# Patient Record
Sex: Female | Born: 1970 | Race: White | Hispanic: No | Marital: Married | Smoking: Never smoker
Health system: Southern US, Community
[De-identification: ages and names within clinical notes are randomized; demographics above are authoritative.]

## PROBLEM LIST (undated history)

## (undated) DIAGNOSIS — F329 Major depressive disorder, single episode, unspecified: Secondary | ICD-10-CM

## (undated) DIAGNOSIS — J302 Other seasonal allergic rhinitis: Secondary | ICD-10-CM

## (undated) DIAGNOSIS — F32A Depression, unspecified: Secondary | ICD-10-CM

## (undated) DIAGNOSIS — M545 Low back pain, unspecified: Secondary | ICD-10-CM

## (undated) DIAGNOSIS — R202 Paresthesia of skin: Secondary | ICD-10-CM

## (undated) DIAGNOSIS — I10 Essential (primary) hypertension: Secondary | ICD-10-CM

## (undated) HISTORY — DX: Other seasonal allergic rhinitis: J30.2

## (undated) HISTORY — DX: Depression, unspecified: F32.A

## (undated) HISTORY — DX: Paresthesia of skin: R20.2

## (undated) HISTORY — DX: Essential (primary) hypertension: I10

## (undated) HISTORY — PX: WISDOM TOOTH EXTRACTION: SHX21

## (undated) HISTORY — DX: Low back pain, unspecified: M54.50

---
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
## 2004-12-03 ENCOUNTER — Inpatient Hospital Stay (HOSPITAL_COMMUNITY): Admission: AD | Admit: 2004-12-03 | Discharge: 2004-12-03 | Payer: Self-pay | Admitting: Obstetrics and Gynecology

## 2004-12-04 ENCOUNTER — Inpatient Hospital Stay (HOSPITAL_COMMUNITY): Admission: AD | Admit: 2004-12-04 | Discharge: 2004-12-04 | Payer: Self-pay | Admitting: Obstetrics & Gynecology

## 2004-12-05 ENCOUNTER — Inpatient Hospital Stay (HOSPITAL_COMMUNITY): Admission: AD | Admit: 2004-12-05 | Discharge: 2005-01-06 | Payer: Self-pay | Admitting: Obstetrics and Gynecology

## 2005-10-01 ENCOUNTER — Encounter: Admission: RE | Admit: 2005-10-01 | Discharge: 2005-12-30 | Payer: Self-pay | Admitting: Family Medicine

## 2006-07-05 IMAGING — US US OB COMP +14 WK
1 series · 13 of 27 positions shown · non-contrast
Comparison: none

CLINICAL DATA: 31 weeks estimated gestational age with pregnancy induced hypertension.  Assess growth.

[Series 1: us ob comp +14 wk · 27 acquisitions, 13 frames shown]
[im 2/27]
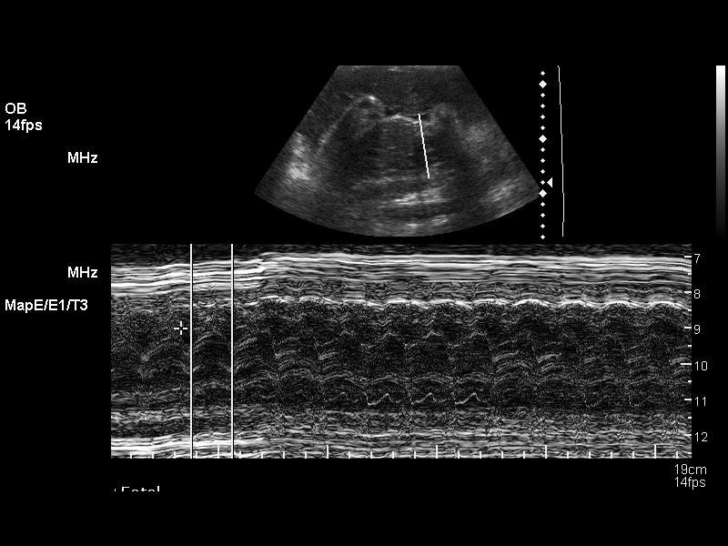
[im 4/27]
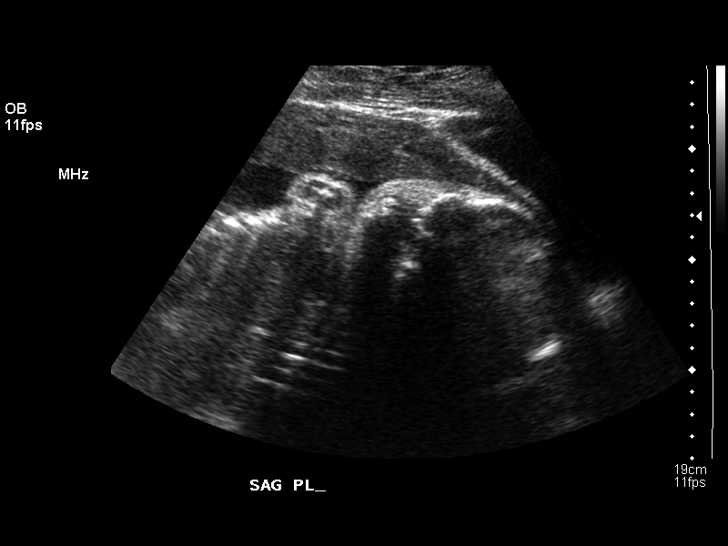
[im 6/27]
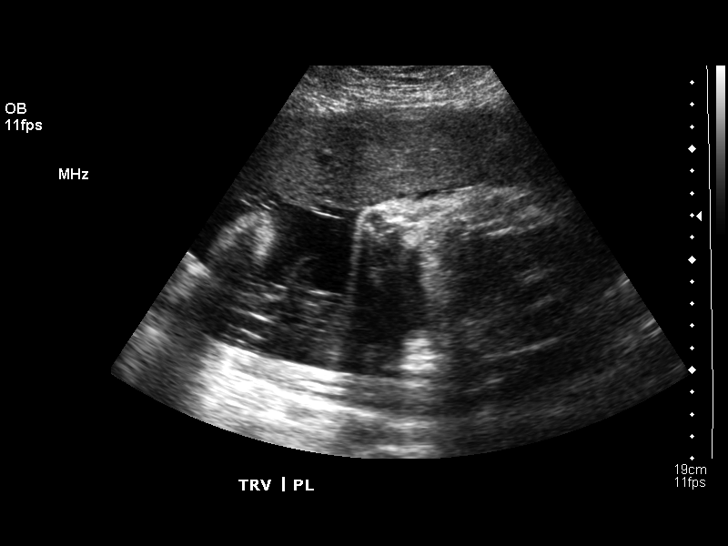
[im 8/27]
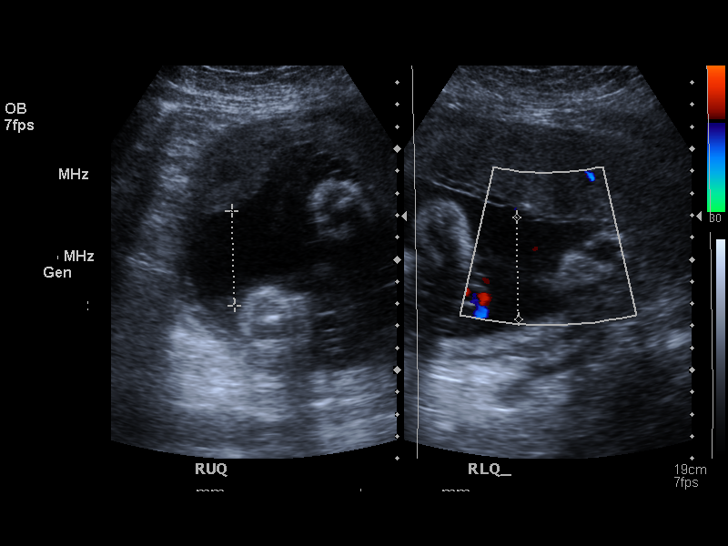
[im 10/27]
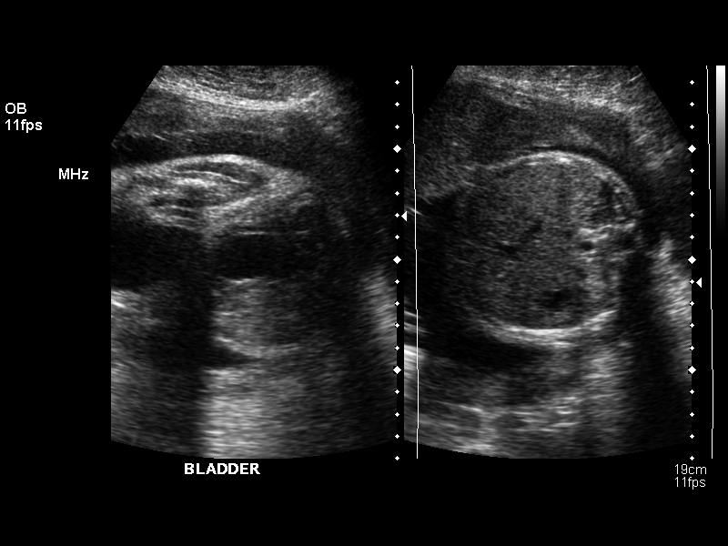
[im 12/27]
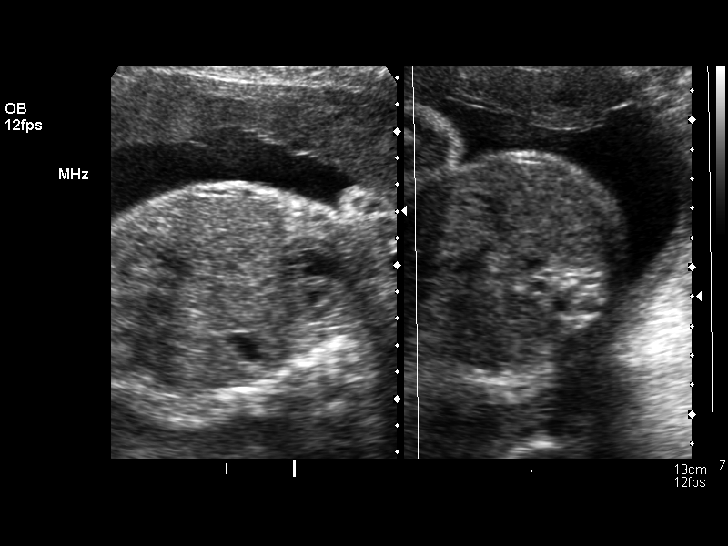
[im 14/27]
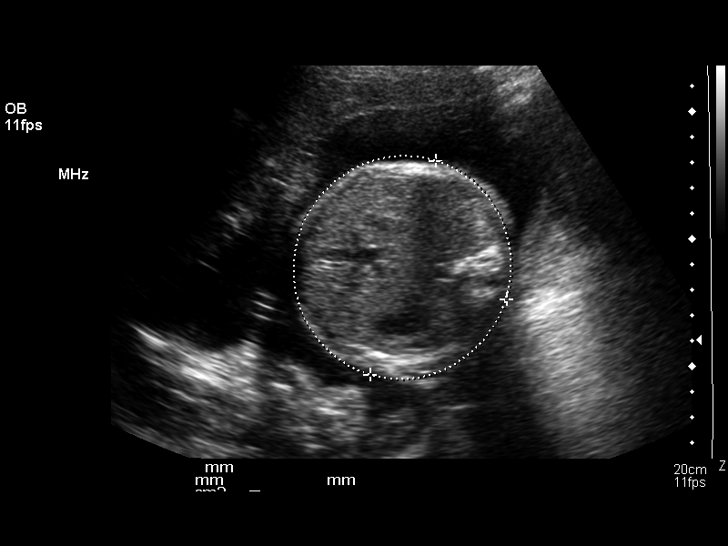
[im 16/27]
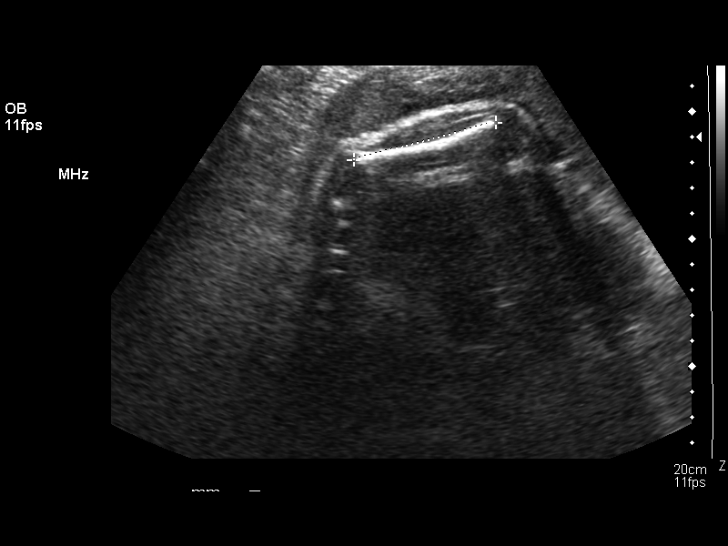
[im 18/27]
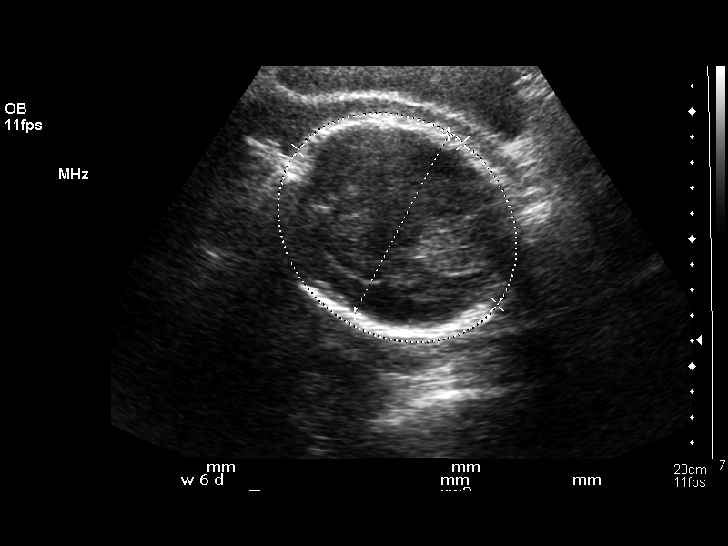
[im 20/27]
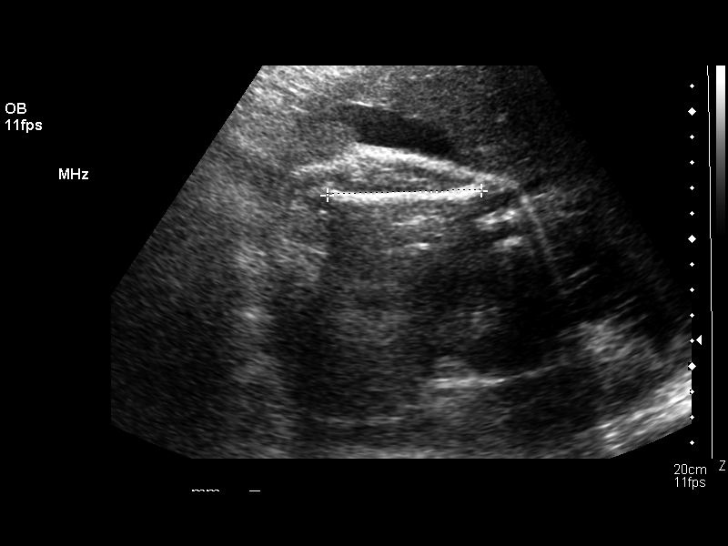
[im 22/27]
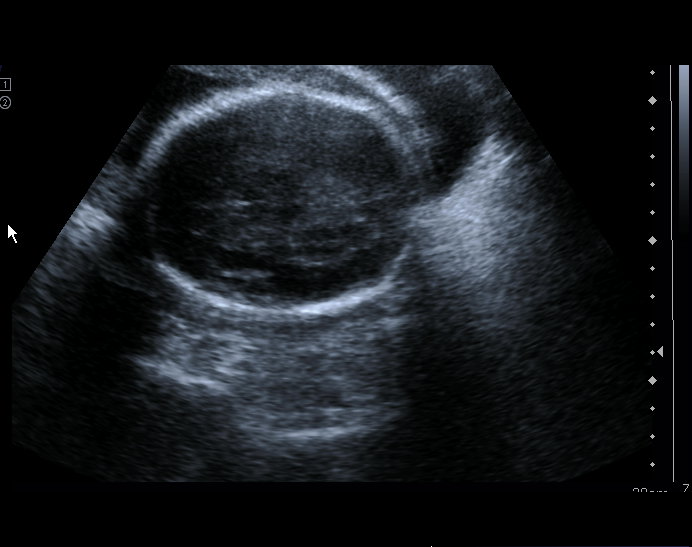
[im 24/27]
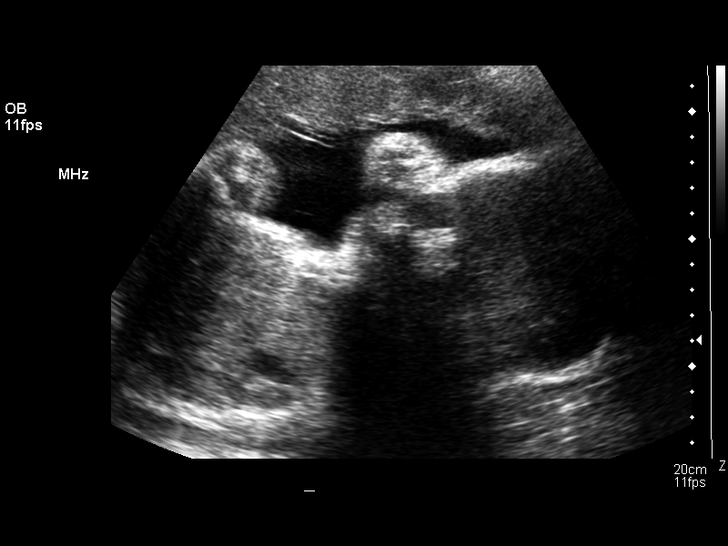
[im 26/27]
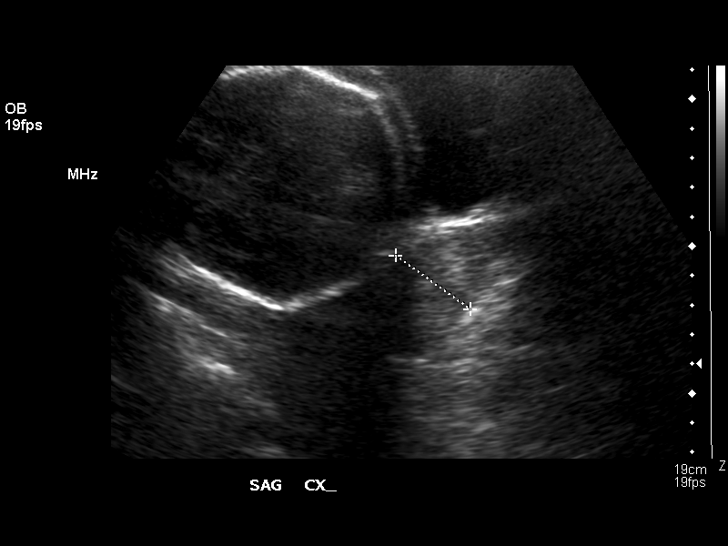

[13 of 27 positions shown; findings below may reference images not displayed]

OBSTETRICAL ULTRASOUND:
Number of Fetuses:  1
Heart Rate:  160
Movement:  Yes
Breathing:  No
Presentation:  Cephalic
Placental Location:  Anterior
Grade:  I
Previa:  No
Amniotic Fluid (Subjective):  Normal
Amniotic Fluid (Objective):   12.8 cm AFI (5th -95th%ile = 8.8 ? 23.8 cm for 31 wks)

FETAL BIOMETRY
BPD:  7.9 cm   31 w 6 d
HC:  28.7 cm   31 w 4 d
AC:  27.1 cm   31 w 2 d
FL:    5.9 cm   30 w 5 d

MEAN GA:  31 w 3 d
Fetal indices are within normal limits.
EFW:  2035 g (H) 50th ? 75th%ile (6623 ? 3530 g) For 31 wks

FETAL ANATOMY
Lateral Ventricles:    Visualized 
Thalami/CSP:      Not visualized 
Posterior Fossa:  Not visualized 
Nuchal Region:    N/A
Spine:      Not visualized 
4 Chamber Heart on Left:      Not visualized   
Stomach on Left:      Visualized 
3 Vessel Cord:    Not visualized 
Cord Insertion site:    Not visualized 
Kidneys:  Visualized 
Bladder:  Visualized 
Extremities:      Not visualized 

ADDITIONAL ANATOMY VISUALIZED:  Diaphragm.

Evaluation limited by:  Advanced gestational age 

MATERNAL UTERINE AND ADNEXAL FINDINGS
Cervix:   3.1 cm Transabdominally
IMPRESSION: 1.  Single intrauterine pregnancy demonstrating an estimated gestational age by ultrasound of 31 weeks and 3 days.  Correlation with assigned estimated gestational age by ultrasound of 30 weeks and 6 days suggests appropriate growth. Currently the estimated fetal weight is just below the 75th percentile for a 31 week gestation.
2.  Subjectively and quantitatively normal amniotic fluid volume and normal cervical length.
3.  No late developing fetal anatomic abnormalities are identified associated with the lateral ventricles, stomach, kidneys or bladder.  A four chamber heart view could not be reassessed due to positioning on today?s exam.

## 2006-07-09 IMAGING — US US FETAL BPP W/O NONSTRESS
1 series · 18 of 28 positions shown · non-contrast
Comparison: none

CLINICAL DATA: Pregnancy induced hypertension.  Assess fetal well-being and obstetrical Doppler.  G1 P0.

[Series 1: us fetal bpp w/o nonstress · non-contrast · 18 of 33 slices shown]
[im 1/33]
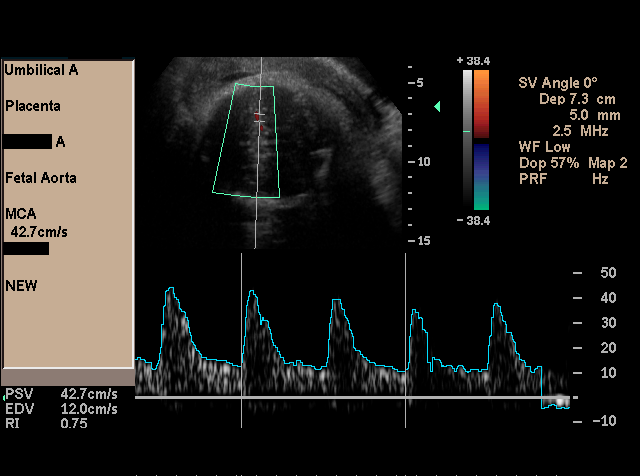
[im 3/33]
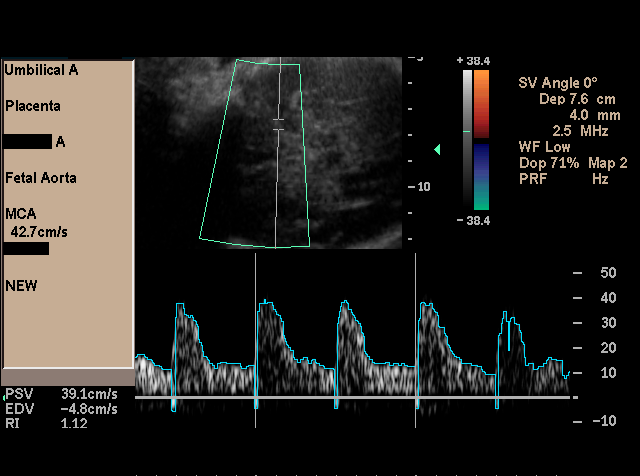
[im 4/33]
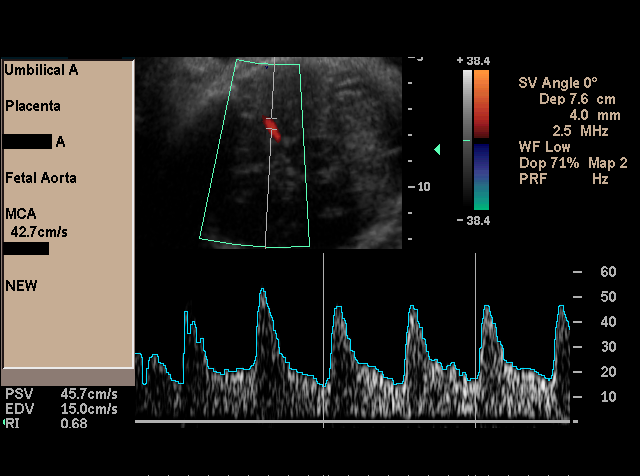
[im 6/33]
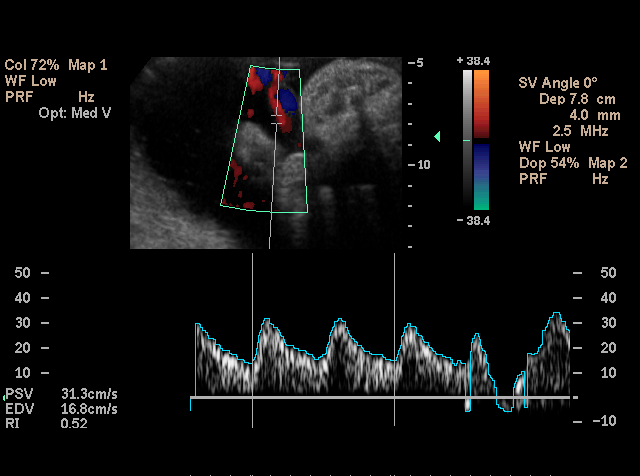
[im 9/33]
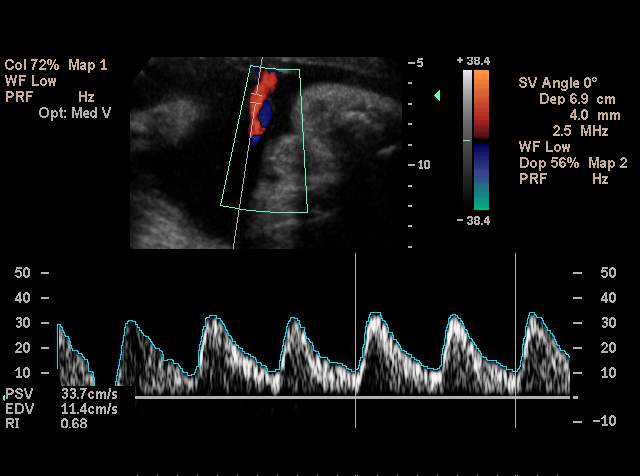
[im 10/33]
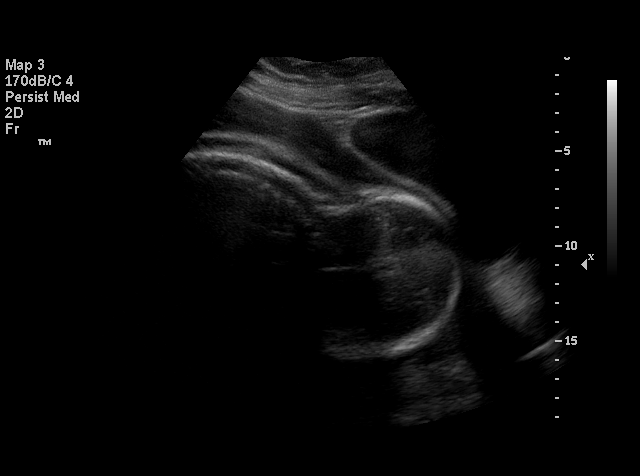
[im 12/33]
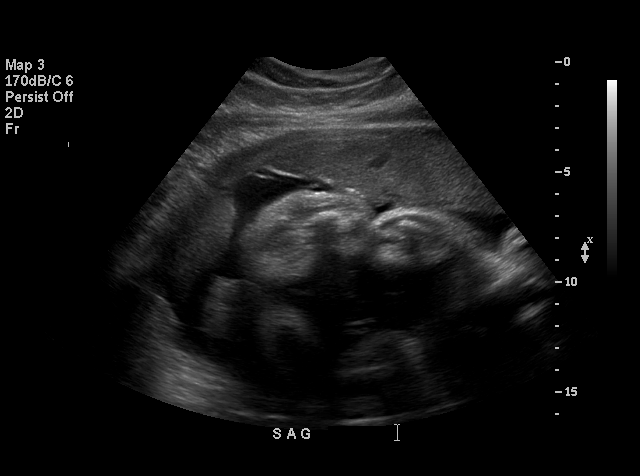
[im 14/33]
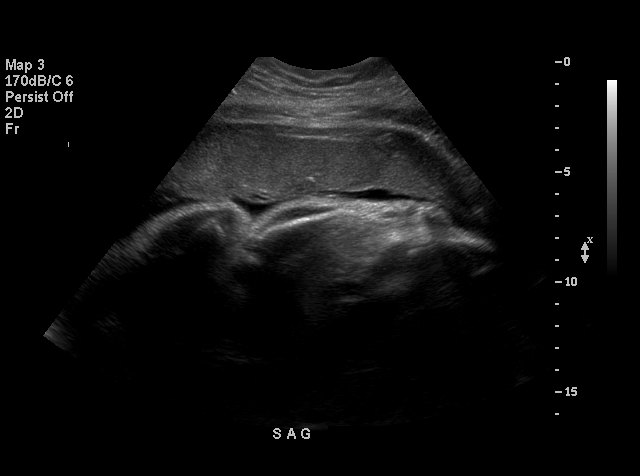
[im 16/33]
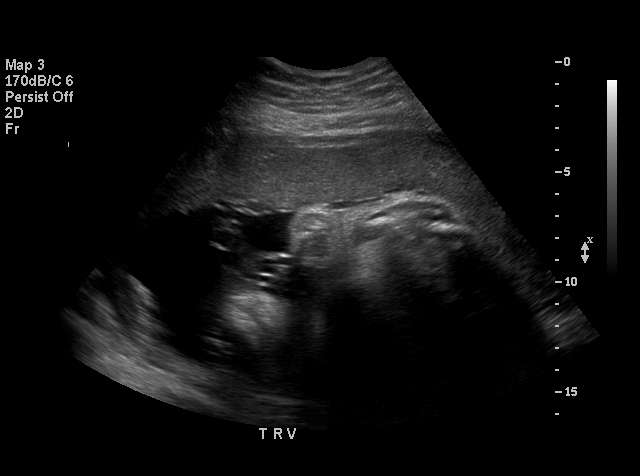
[im 17/33]
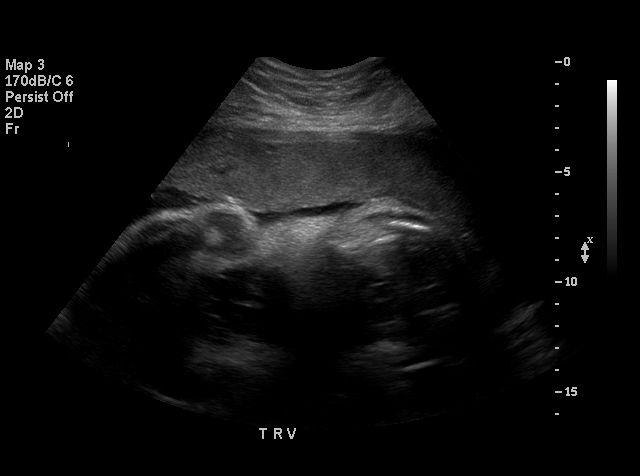
[im 19/33]
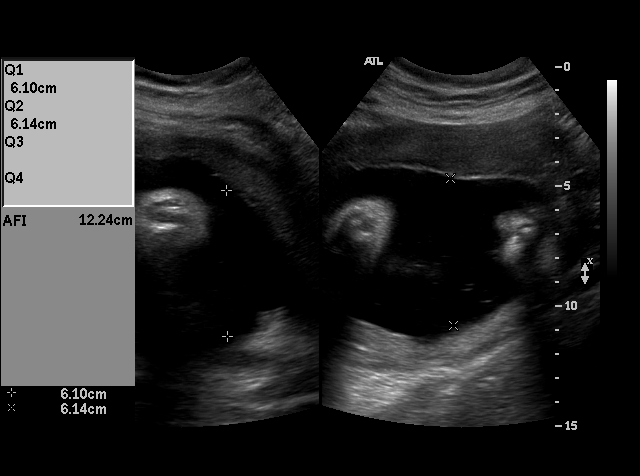
[im 21/33]
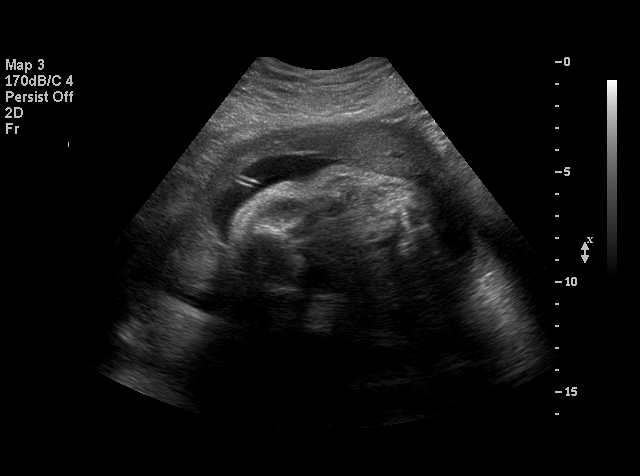
[im 23/33]
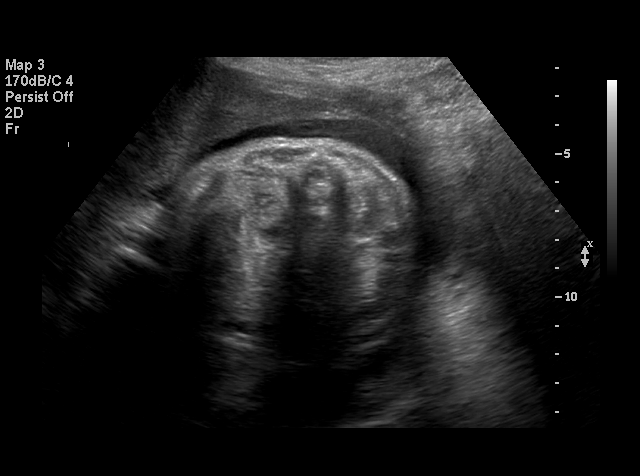
[im 25/33]
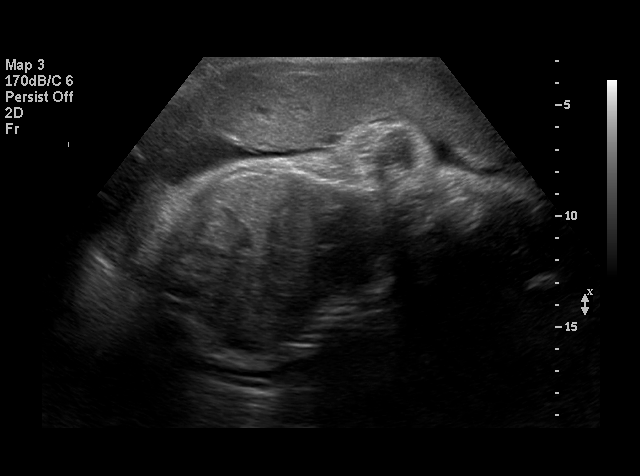
[im 27/33]
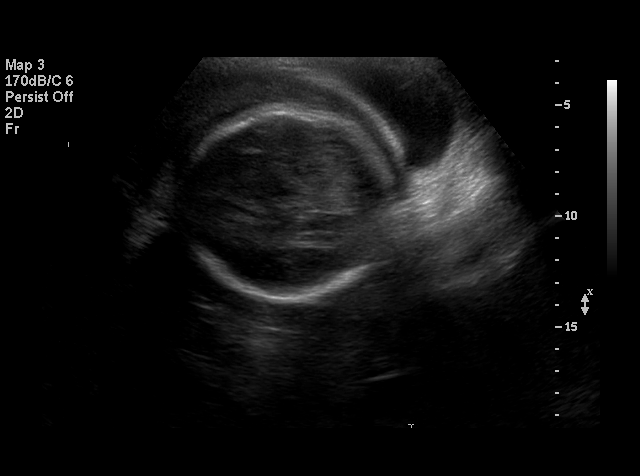
[im 29/33]
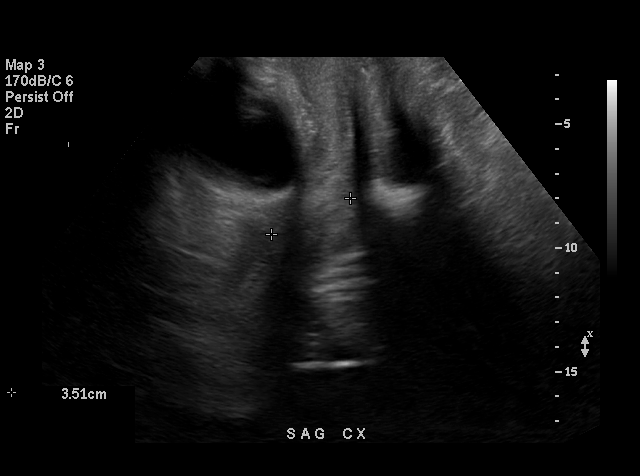
[im 30/33]
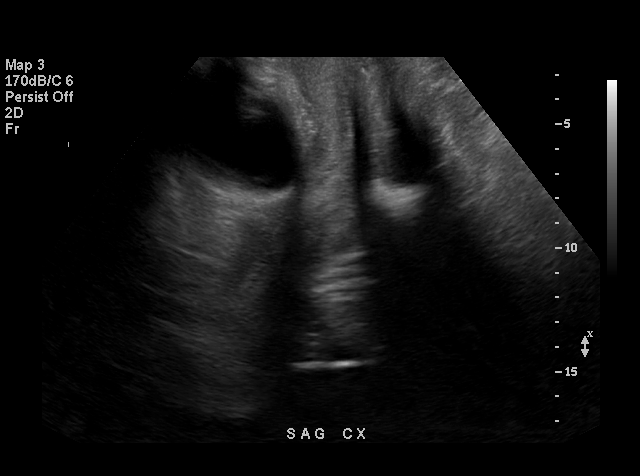
[im 33/33]
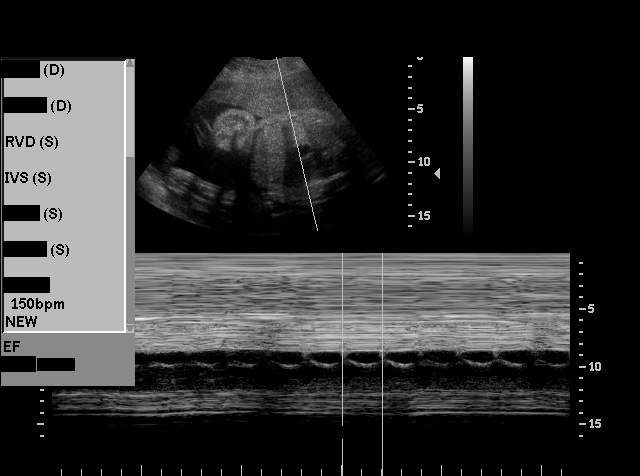

[18 of 28 positions shown; findings below may reference images not displayed]

BIOPHYSICAL PROFILE

 Number of Fetuses:  1
 Heart rate:  150
 Movement:  Yes
 Breathing:  Yes
 Presentation:  Cephalic
 Placental Location:  Anterior
 Grade:  I
 Previa:  No
 Amniotic Fluid (Subjective):  Normal
 Amniotic Fluid (Objective):  20.7 cm AFI (5th -95th%ile = 8.8 ? 23.8 cm for 31 wks)

 Fetal measurements and complete anatomic evaluation were not requested.  The following fetal anatomy was visualized on this exam:  Lateral ventricles, stomach, kidneys and bladder.  

 BPP SCORING
 Movements:  2  Time:  30 minutes
 Breathing:  2
 Tone:  2
 Amniotic Fluid:  2
 Total Score:  8

 MATERNAL UTERINE AND ADNEXAL FINDINGS
 Cervix:  3.7 cm Translabially
IMPRESSION: 1.  Single living intrauterine fetus in cephalic presentation with biophysical profile score [DATE] in 30 minutes of scanning.  
 2.  Normal amniotic fluid volume with AFI 20.7 cm.  
 DOPPLER ULTRASOUND OF FETUS:

 Umbilical Artery S/D Ratio:  2.45    (NL< 4.00)

 Middle Cerebral Artery PI:  1.43    (NL> 1.48)
IMPRESSION: Slightly abnormal middle cerebral artery pulsatility index indicating very slightly increased diastolic flow.

## 2006-07-12 IMAGING — US US FETAL BPP W/O NONSTRESS
1 series · 14 of 28 positions shown · non-contrast
Comparison: none

CLINICAL DATA: Assigned gestational age is 31 weeks 6 days.

[Series 1: us fetal bpp w/o nonstress · 0.41mm/px · 40 acquisitions, 14 frames shown]
[im 2/40]
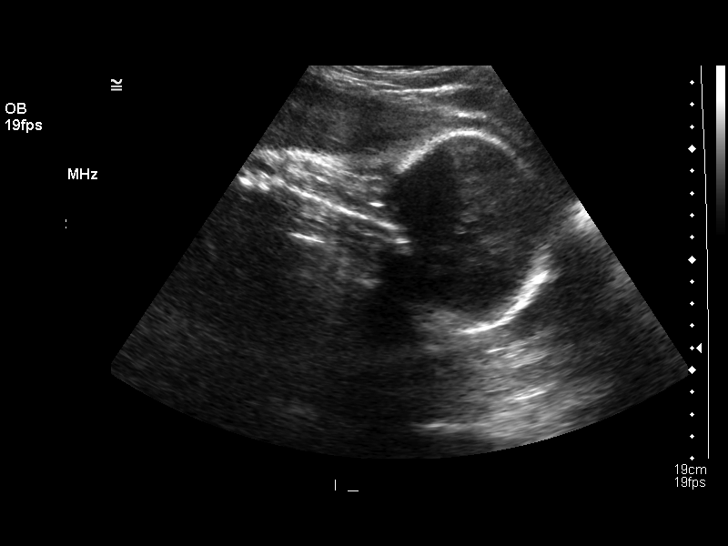
[im 5/40]
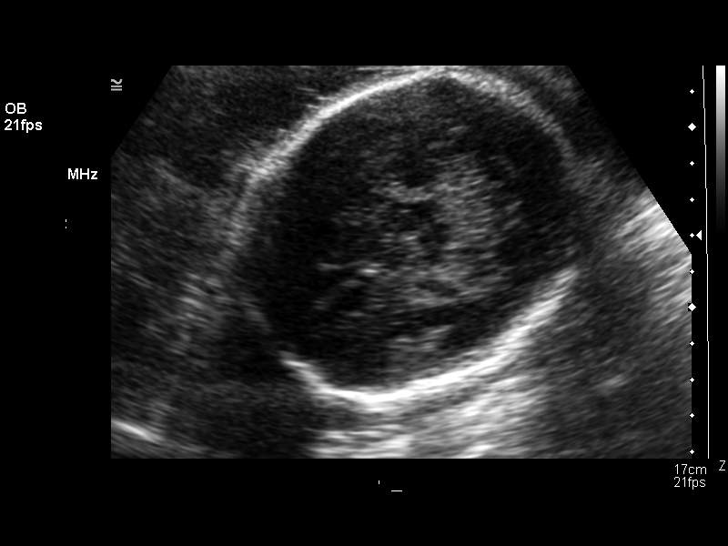
[im 8/40]
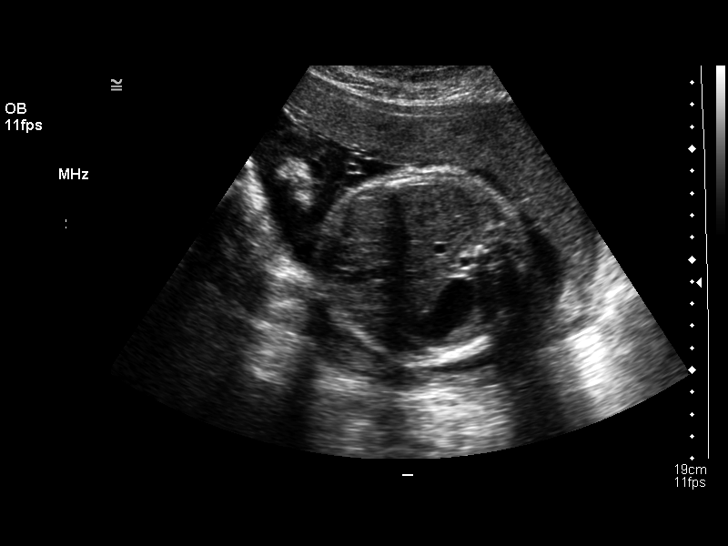
[im 11/40]
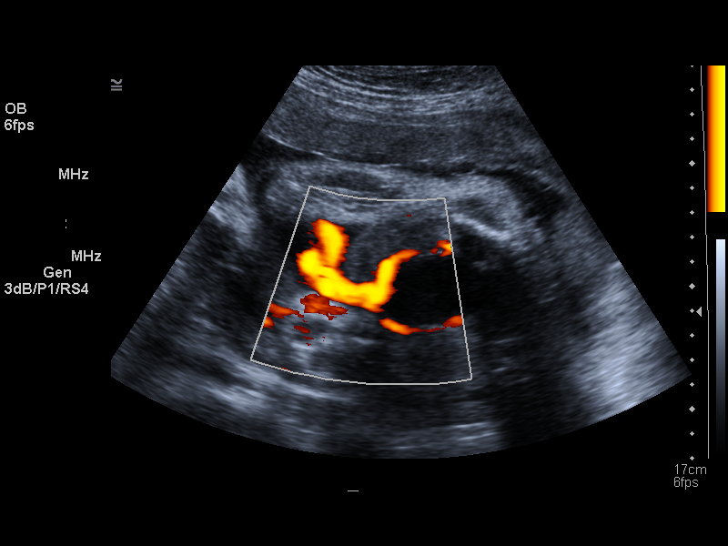
[im 14/40]
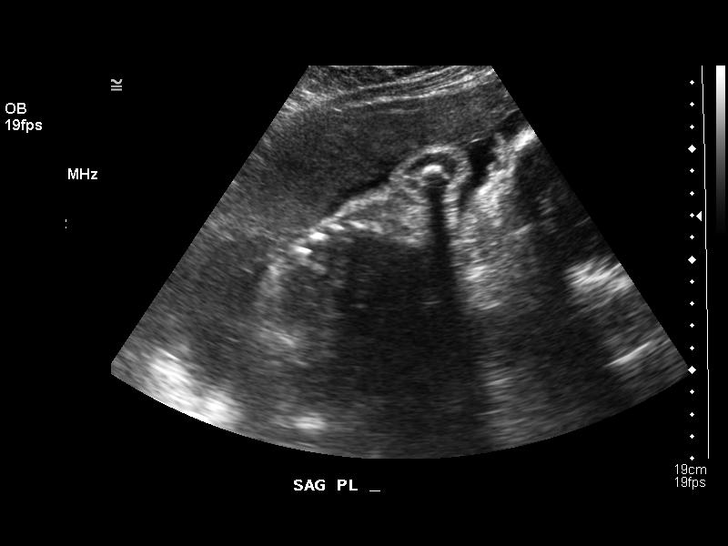
[im 16/40]
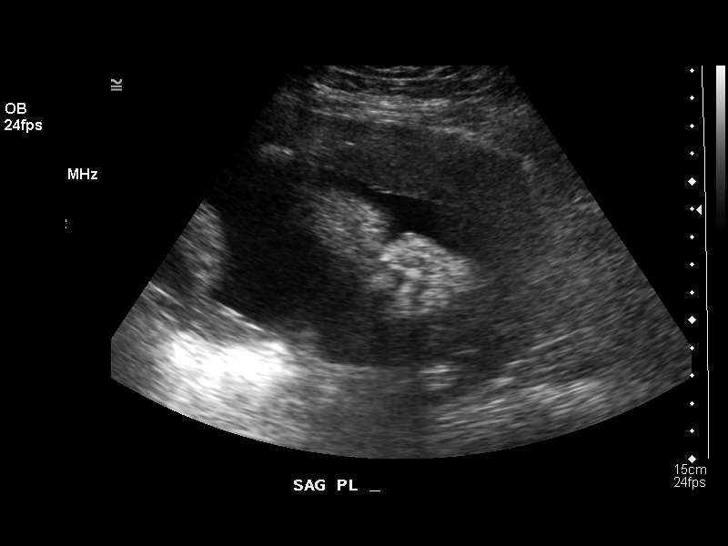
[im 19/40]
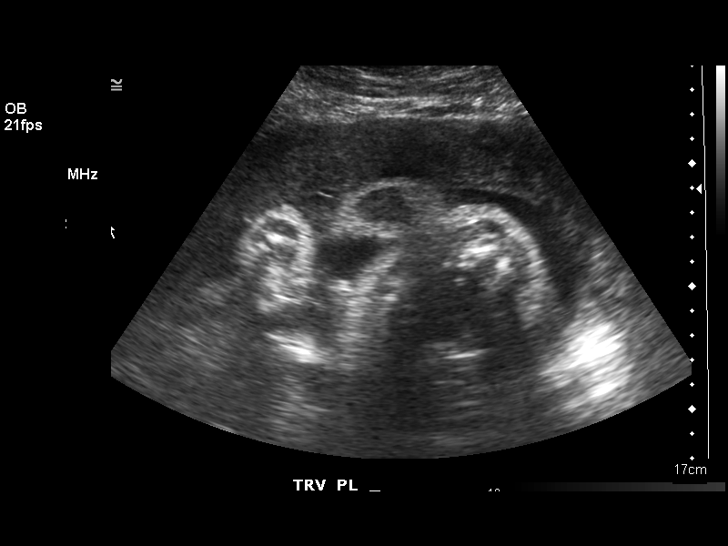
[im 22/40]
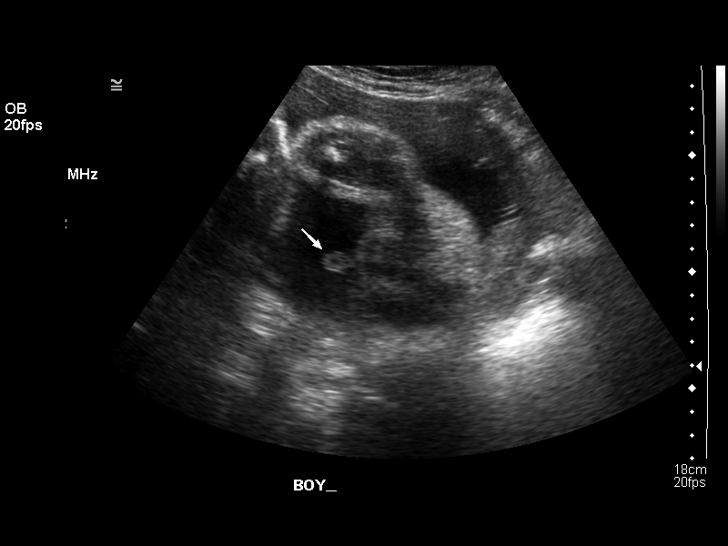
[im 25/40]
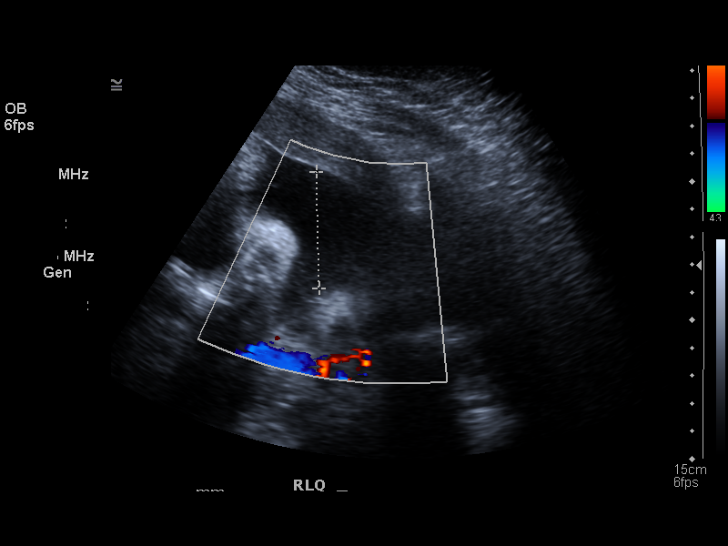
[im 28/40]
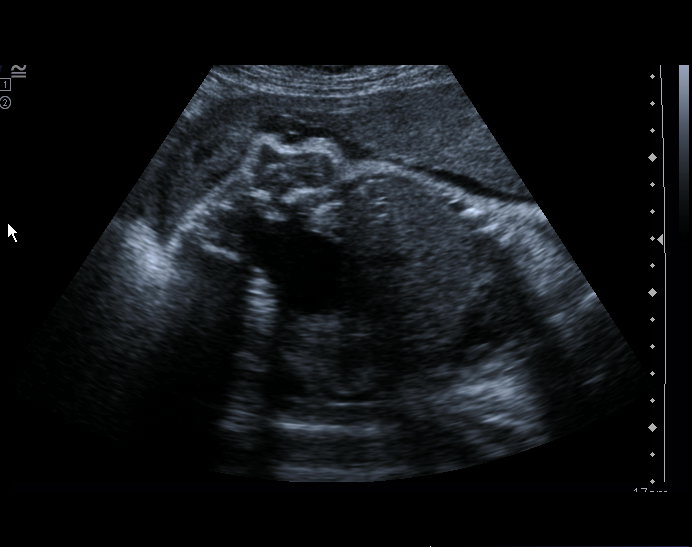
[im 31/40]
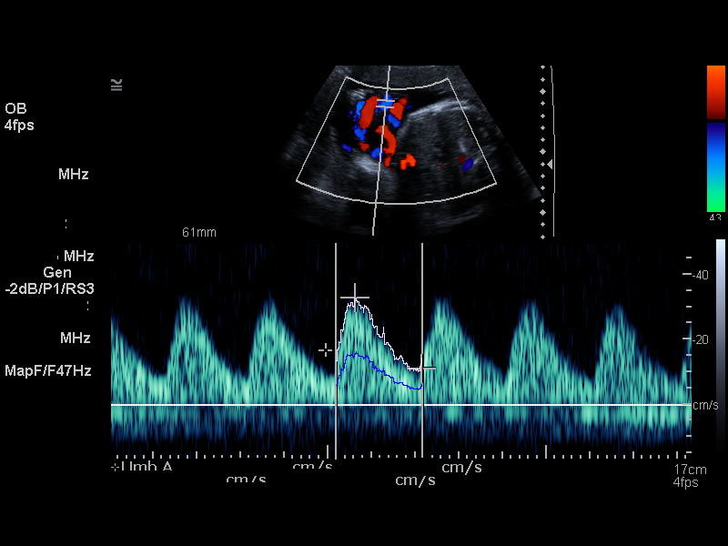
[im 34/40]
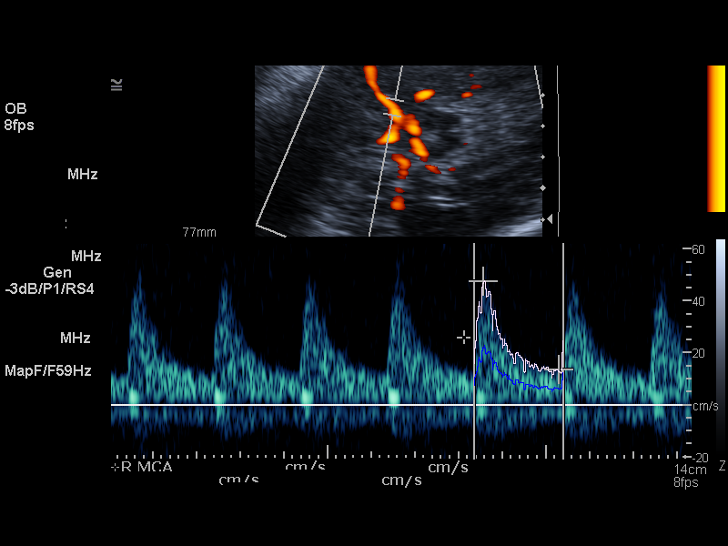
[im 37/40]
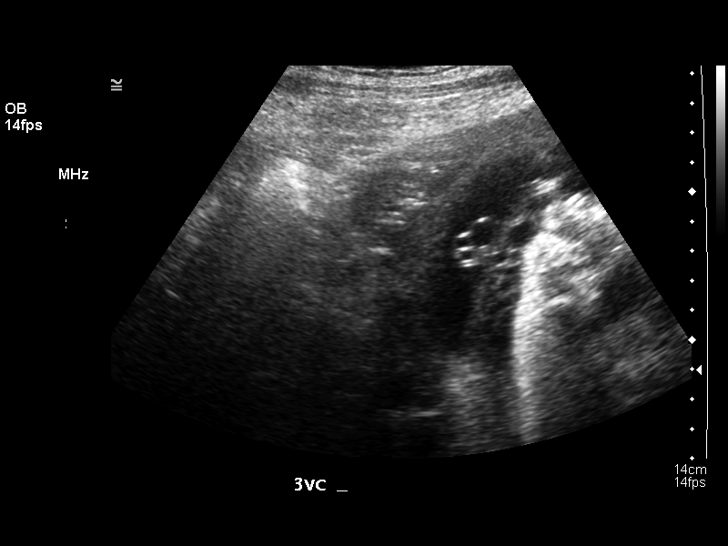
[im 40/40]
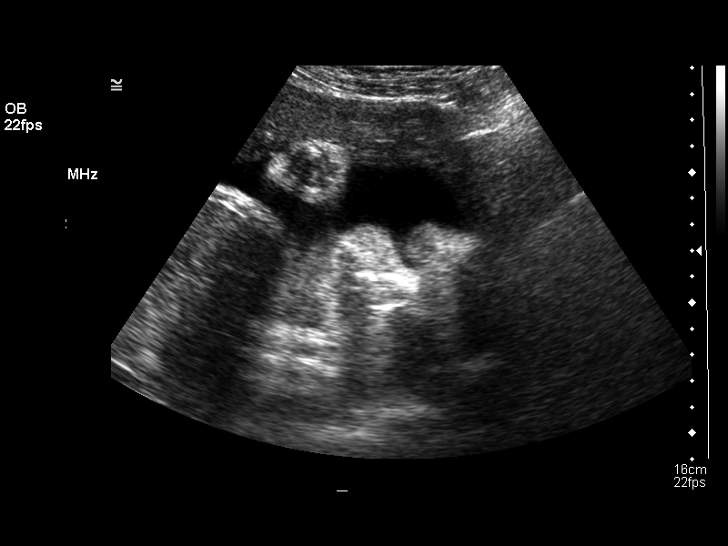

[14 of 28 positions shown; findings below may reference images not displayed]

BIOPHYSICAL PROFILE

 Number of Fetuses:  1
 Heart rate:  144 bpm
 Movement:  Yes 
 Breathing:  Yes 
 Presentation:  Cephalic 
 Placental Location:  Anterior
 Grade:  1
 Previa:  No 
 Amniotic Fluid (Subjective):  Normal 
 Amniotic Fluid (Objective):  AFI 17.1 cm (3th-13th %ile = 8.6-24.2 cm for 32 weeks) 

 Fetal measurements and complete anatomic evaluation were not requested.  The following fetal anatomy was visualized on this exam:  Lateral ventricles, thalami, posterior fossa, stomach on the left, three vessel cord, kidneys, bladder and diaphragm

 BPP SCORING
 Movements:  2
 Breathing:  2
 Tone:  2
 Amniotic Fluid:  2
 Total Score:  [DATE]

 MATERNAL UTERINE AND ADNEXAL FINDINGS
 Cervix:  Not evaluated. 

 DOPPLER ULTRASOUND OF FETUS:

 Umbilical Artery S/D Ratio:  2.72 (NL< 3.85)

 Middle Cerebral Artery PI:  1.56 (NL> 1.44)
IMPRESSION: There is a single living intrauterine gestation in cephalic presentation.   The biophysical score is [DATE] over a 30 minute period.   The umbilical artery and middle cerebral artery Doppler exam is normal.

## 2006-07-26 IMAGING — US US OB FOLLOW-UP
1 series · 13 of 28 positions shown · non-contrast
Comparison: none

CLINICAL DATA: 31 weeks estimated gestational age with pregnancy induced hypertension.   Assess growth and fetal well being.

[Series 1: us ob follow-up · 0.35mm/px · 13 of 34 slices shown]
[im 2/34]
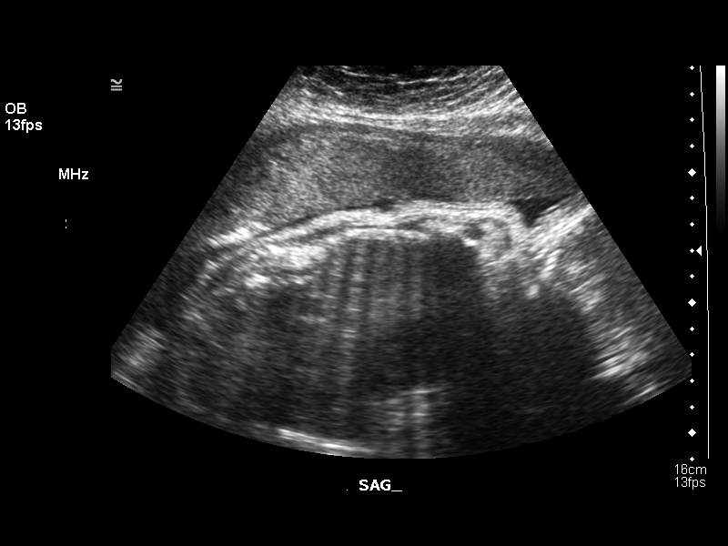
[im 4/34]
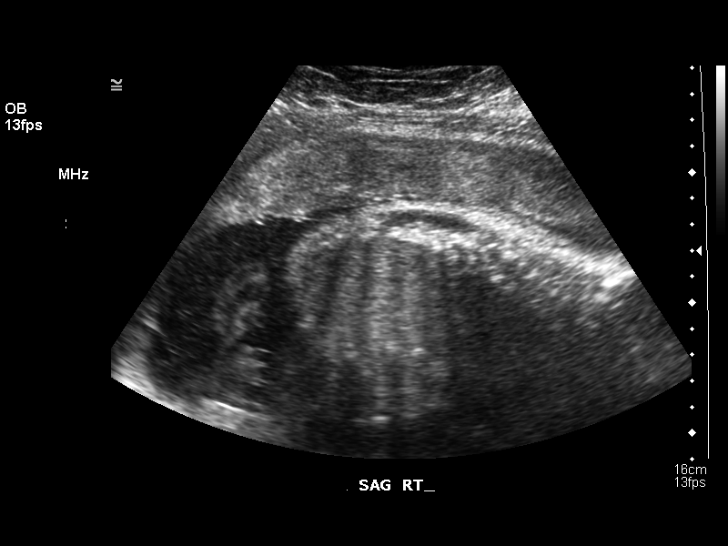
[im 7/34]
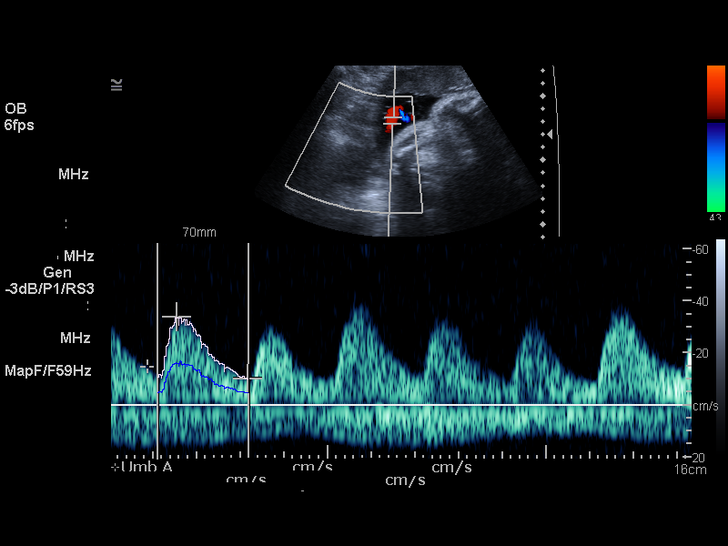
[im 9/34]
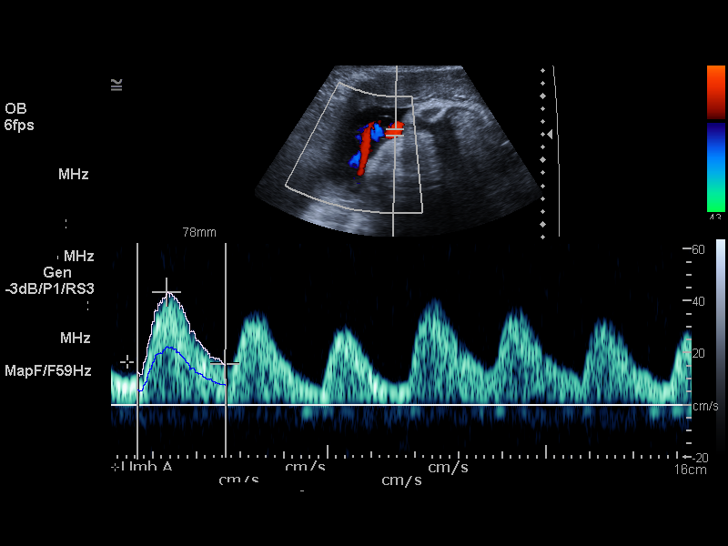
[im 12/34]
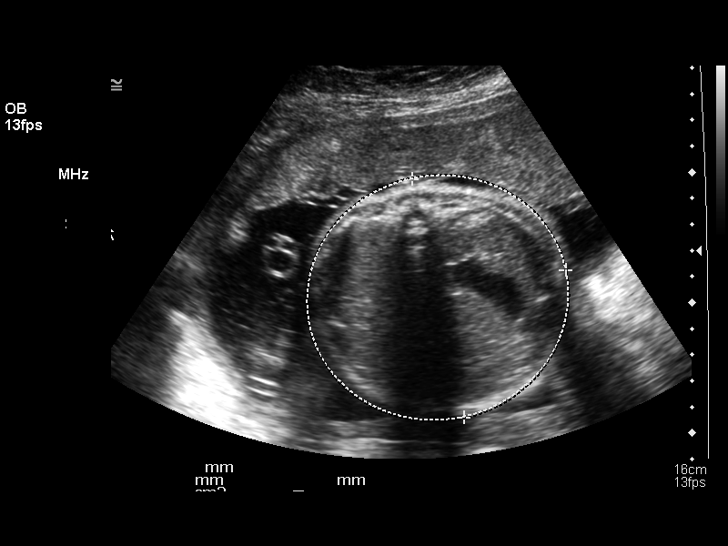
[im 14/34]
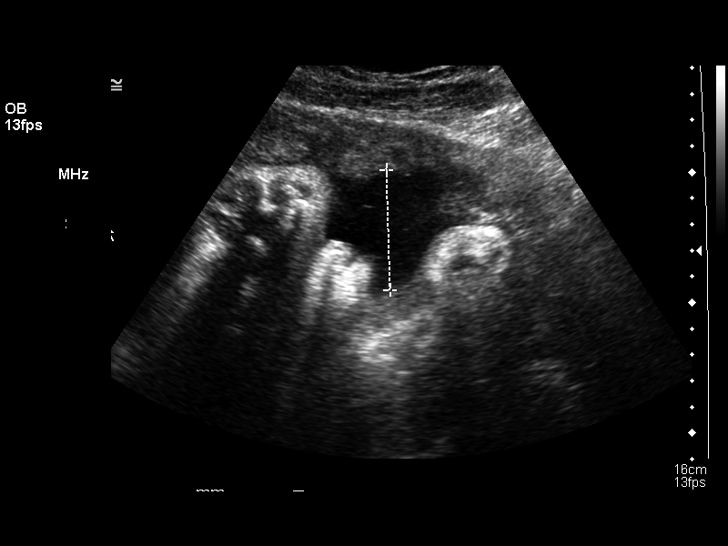
[im 18/34]
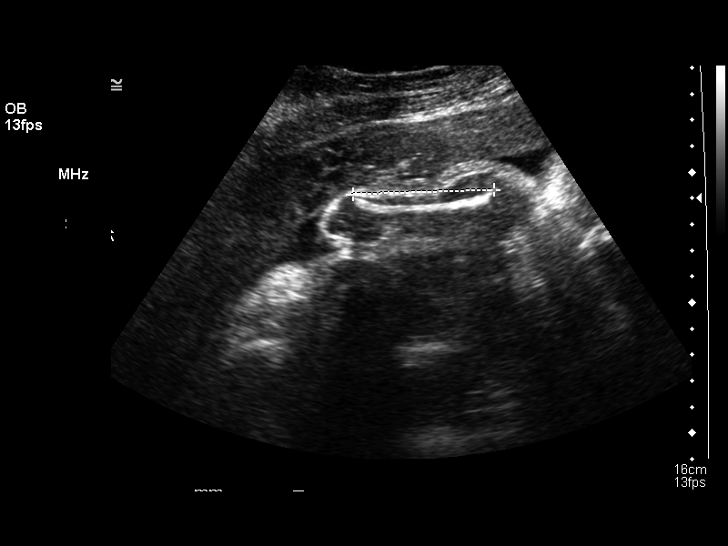
[im 20/34]
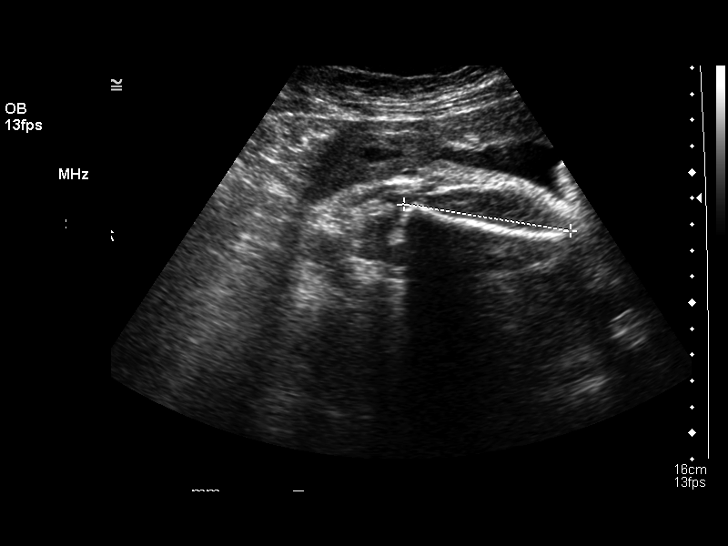
[im 23/34]
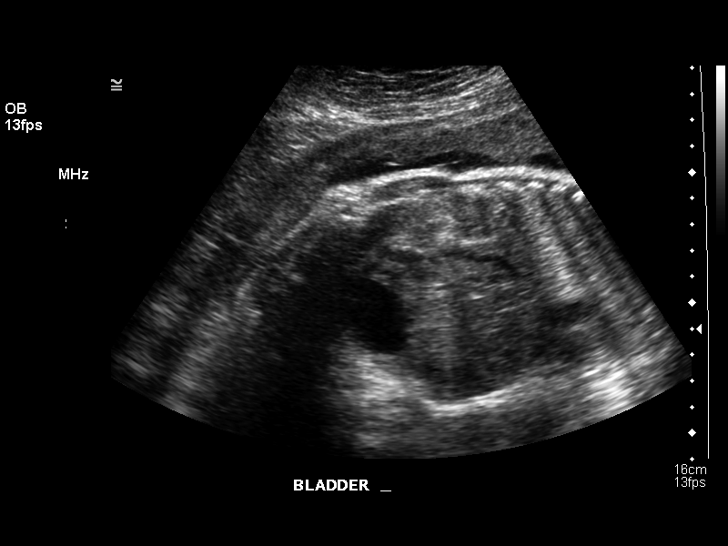
[im 25/34]
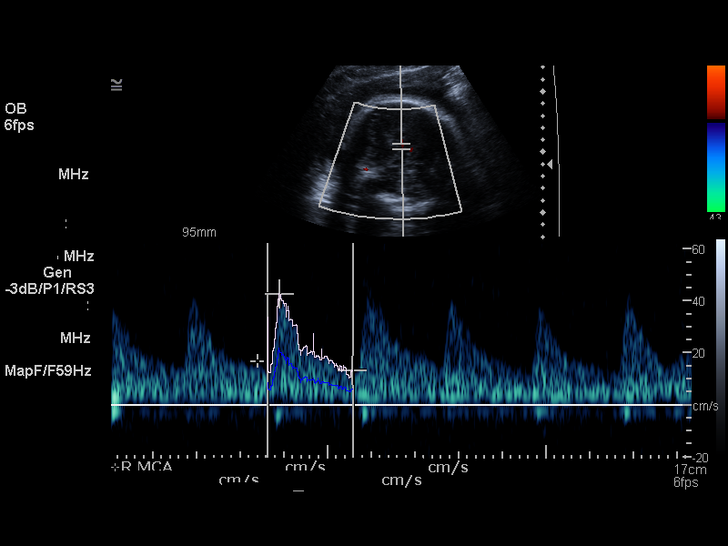
[im 27/34]
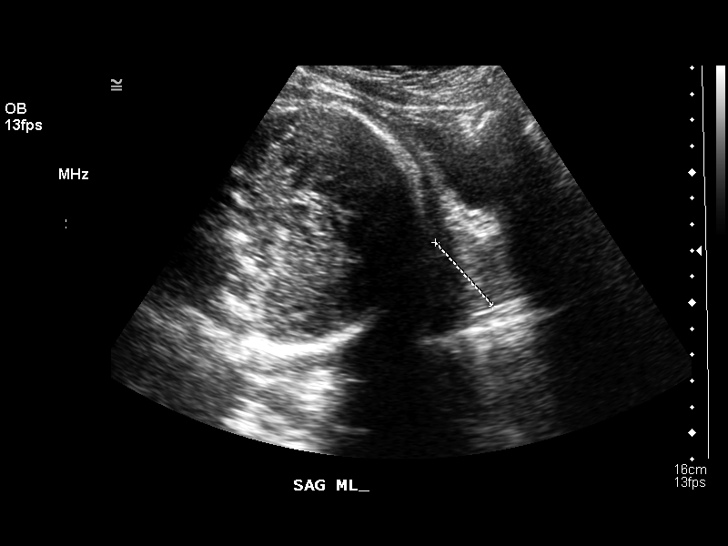
[im 30/34]
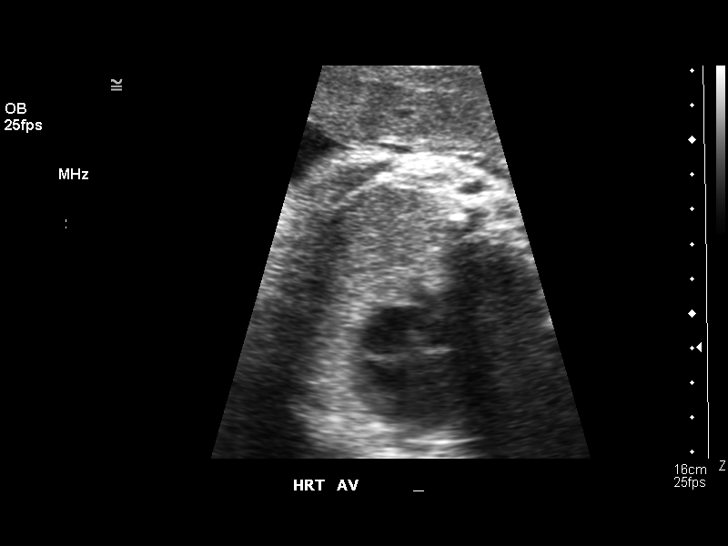
[im 32/34]
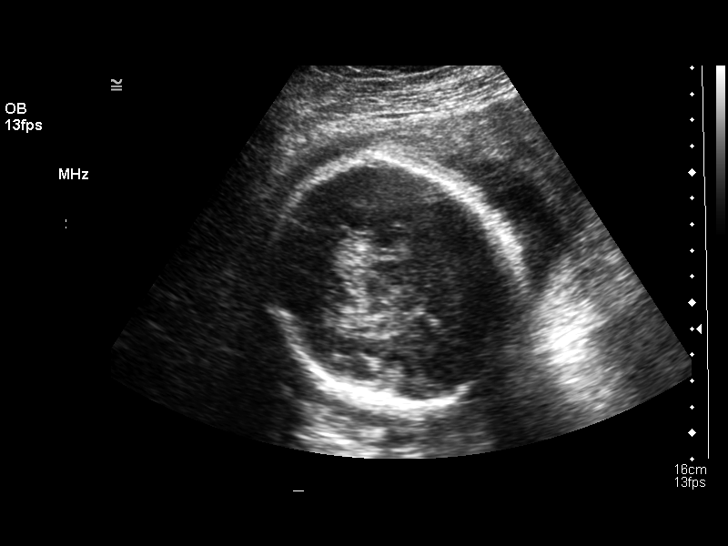

[13 of 28 positions shown; findings below may reference images not displayed]

OBSTETRICAL ULTRASOUND RE-EVALUATION:
Number of Fetuses:  1
Heart Rate:  157 bpm
Movement:  Yes 
Breathing:  Yes 
Presentation:  Cephalic 
Placental Location:  Anterior
Grade:  2
Previa:  No 
Amniotic Fluid (subjective):  Normal 
Amniotic Fluid (objective):  AFI 13 cm (1th-81th %ile = 8.1-24.8 cm for 34 weeks) 

FETAL BIOMETRY
BPD:  8.5 cm  34 w  3 d
HC:  30.8 cm  34 w  3 d
AC:  30.5 cm  34 w  4 d
FL:  6.4 cm  33 w  1 d

Fetal indices are within normal limits. 

MEAN GA:  33 w  5 d 
Assigned GA:  33 w  6 d

EFW:  6184 grams (H) 50-75th %ile (9029-7007 g) for 34 weeks 

FETAL ANATOMY
Lateral Ventricles:  Visualized 
Thalami/CSP:  Visualized 
Posterior Fossa:  Previously visualized 
Nuchal Region:  Not Visualized 
Spine:  Not Visualized 
4 Chamber Heart on Left:  Not Visualized 
Stomach on Left:  Visualized 
3 Vessel Cord:  Visualized 
Cord Insertion Site:  Not Visualized 
Kidneys:  Visualized 
Bladder:  Visualized 
Extremities:  Not Visualized 

Evaluation limited by:  Fetal position

  MATERNAL UTERINE AND ADNEXAL FINDINGS
Cervix:  3.2 cm transabdominally 

BIOPHYSICAL PROFILE

Movement:  2  Time:  20 minutes
Breathing:  2
Tone:  2
Amniotic Fluid:  2

Total Score:  [DATE]

DOPPLER ULTRASOUND OF FETUS:

Umbilical Artery S/D Ratio:  3.09 (NL< 3.57)

Middle Cerebral Artery PI:  1.41 (NL> 1.35)
IMPRESSION: 1.   Single intrauterine pregnancy demonstrating an estimated gestational age by ultrasound of 33 weeks and 5 days.   Correlation with assigned gestational age of 33 weeks and 6 days suggests appropriate growth.   The estimated fetal weight remains between the 50 and 75th percentile on today?s exam and suggests appropriate linear growth in comparison with prior growth performed at 31 weeks.   

2.  Subjectively and quantitatively normal amniotic fluid volume.  Normal cervical length. 

3.  Biophysical profile score [DATE].  

4.  Normal umbilical artery systolic to diastolic ratio and middle cerebral artery pulsatility index.   No absence or reversal of end diastolic flow was seen on any of the interrogated strips. 

5.  No late developing fetal anatomic abnormalities are seen associated with the lateral ventricles, stomach, kidneys or bladder.   A four chamber heart view could not be well visualized due to positioning on today?s exam.

## 2006-08-03 ENCOUNTER — Encounter: Admission: RE | Admit: 2006-08-03 | Discharge: 2006-08-03 | Payer: Self-pay | Admitting: Obstetrics and Gynecology

## 2006-08-03 IMAGING — US US AMNIOCENTESIS
1 series · 6 of 6 positions shown · non-contrast
Comparison: none

[Series 1: us amniocentesis · 0.43mm/px · 6 of 6 slices shown]
[im 1/6]
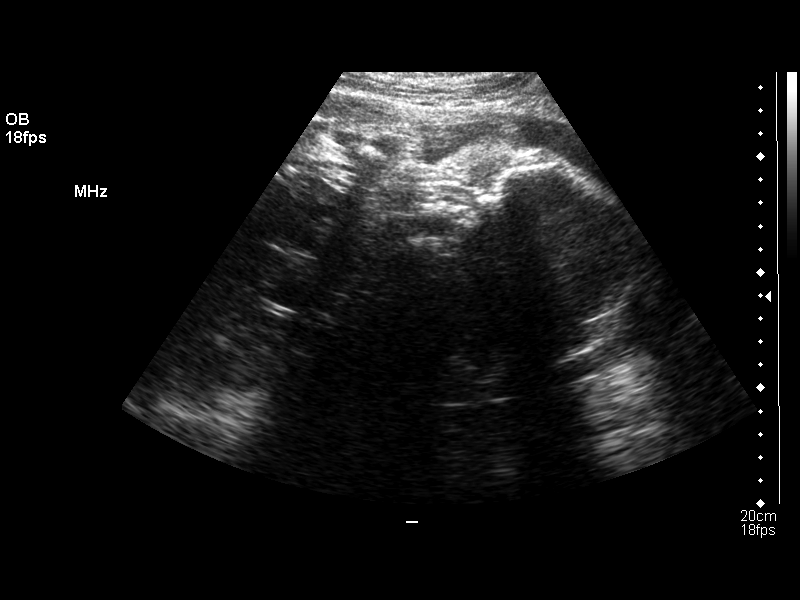
[im 2/6]
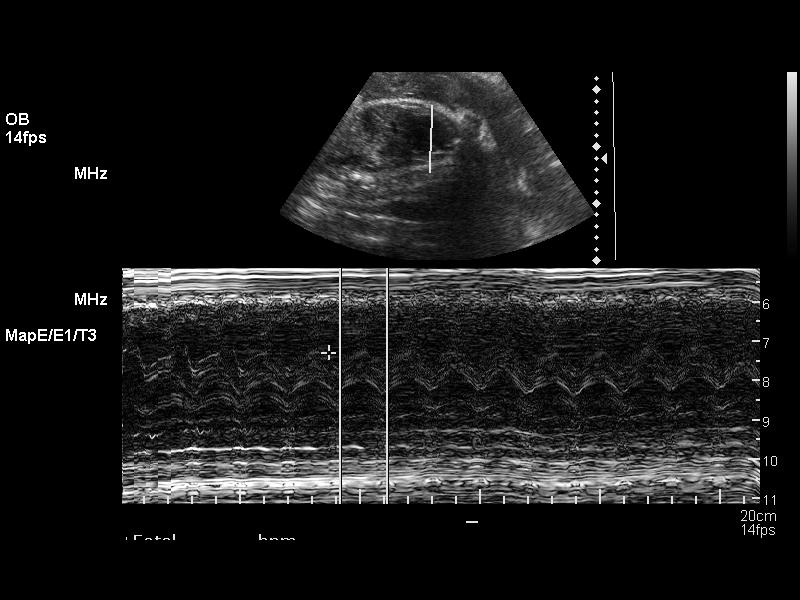
[im 3/6]
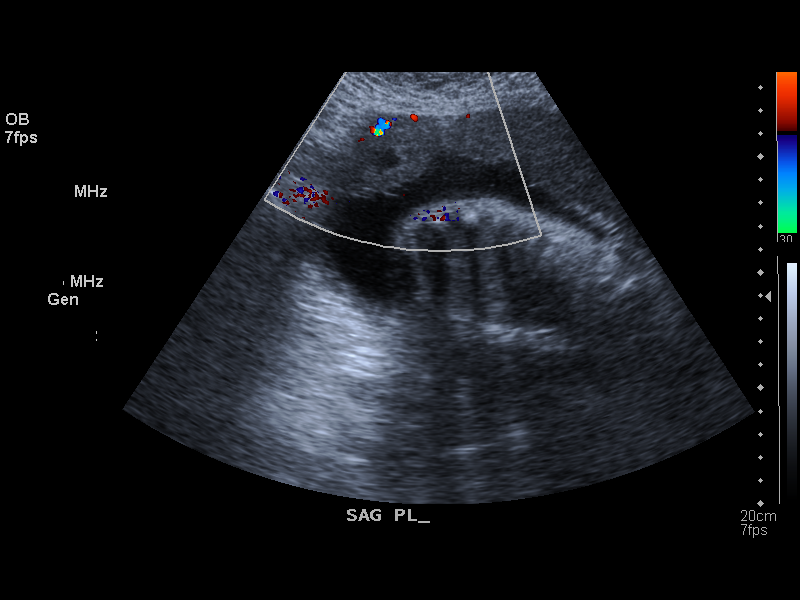
[im 4/6]
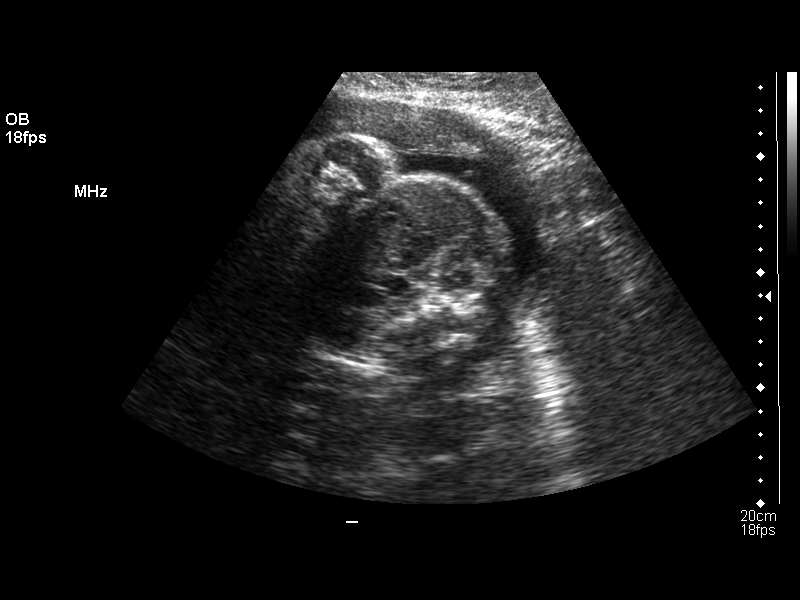
[im 5/6]
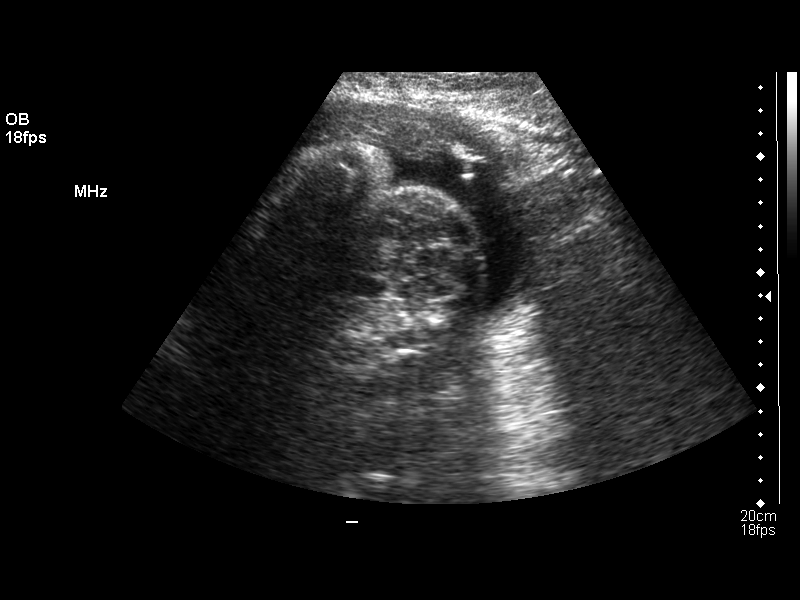
[im 6/6]
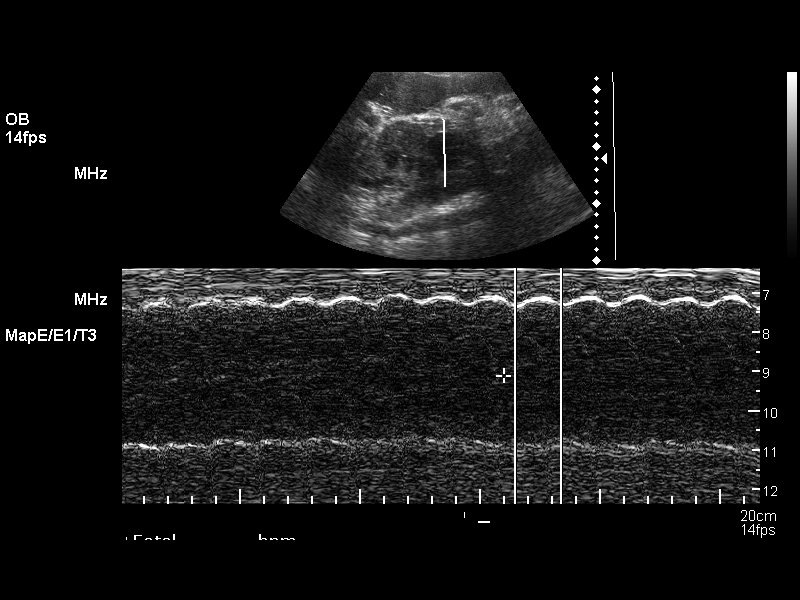

[6 of 6 positions shown; findings below may reference images not displayed]

ULTRASOUND AMNIOCENTESIS:

 Ultrasound was utilized to perform amniocentesis by the requesting physician.

## 2006-09-27 ENCOUNTER — Inpatient Hospital Stay (HOSPITAL_COMMUNITY): Admission: AD | Admit: 2006-09-27 | Discharge: 2006-09-29 | Payer: Self-pay | Admitting: *Deleted

## 2008-03-14 ENCOUNTER — Emergency Department (HOSPITAL_COMMUNITY): Admission: EM | Admit: 2008-03-14 | Discharge: 2008-03-14 | Payer: Self-pay | Admitting: Emergency Medicine

## 2008-04-18 ENCOUNTER — Ambulatory Visit (HOSPITAL_COMMUNITY): Admission: RE | Admit: 2008-04-18 | Discharge: 2008-04-18 | Payer: Self-pay | Admitting: Family Medicine

## 2008-07-07 ENCOUNTER — Emergency Department (HOSPITAL_COMMUNITY): Admission: EM | Admit: 2008-07-07 | Discharge: 2008-07-07 | Payer: Self-pay | Admitting: Emergency Medicine

## 2009-08-25 ENCOUNTER — Emergency Department (HOSPITAL_COMMUNITY): Admission: EM | Admit: 2009-08-25 | Discharge: 2009-08-25 | Payer: Self-pay | Admitting: Family Medicine

## 2010-10-11 NOTE — H&P (Signed)
Amber Shah, Amber Shah             ACCOUNT NO.:  [id number]   MEDICAL RECORD NO.:  [id number]          PATIENT TYPE:  INP   LOCATION:  9166                          FACILITY:  [facility]   PHYSICIAN:  Lenoard Aden, M.D.DATE OF BIRTH:  [birth year]   DATE OF ADMISSION:  09/27/2006  DATE OF DISCHARGE:                              HISTORY & PHYSICAL   CHIEF COMPLAINT:  Spontaneous rupture of membranes at 1800.   She is a 40 year old white female, G2, P1, at 72 plus weeks gestation  who presents with spontaneous rupture of membranes and subsequent  contractions.   ALLERGIES:  No known drug allergies.   MEDICATIONS:  1. Prenatal vitamins.  2. Folic acid.  3. Zyrtec.   PAST MEDICAL HISTORY:  History of Chlamydia.   SOCIAL HISTORY:  She denies tobacco use.  Denies domestic or physical  violence.   FAMILY HISTORY:  She has a family history of stroke, hypertension and  thyroid dysfunction, ovarian cancer and heart disease.   She has a previous history with her first pregnancy of severe  preeclampsia.  She has a history of one uncomplicated abortion.  First  pregnancy delivered at 35.5 weeks.   Pregnancy course to date has been uncomplicated.  The patient was on  baby aspirin in early pregnancy, discontinued at 34 weeks.   PHYSICAL EXAMINATION:  GENERAL:  She is a well-developed, well-  nourished, white female in no acute distress.  VITAL SIGNS:  Blood pressure is 132/85.  HEENT:  Normal.  LUNGS:  Clear.  HEART:  Regular rate and rhythm.  ABDOMEN:  Soft, nontender.  Ultrasound done 4 days ago.  Estimated fetal  weight at 6.5 to 7 pounds.  PELVIC:  Cervix upon admission is 2-3 cm, 80%, vertex, -1.  EXTREMITIES:  No cords.  NEUROLOGIC:  Nonfocal.  SKIN:  Intact.   IMPRESSION:  A 37-week intrauterine pregnancy with spontaneous rupture  of membranes.  History of preeclampsia with stable blood pressure.   PLAN:  The plan will be to admit, Pitocin augmentation as needed,  epidural  as needed.      Lenoard Aden, M.D.  Electronically Signed     RJT/MEDQ  D:  09/28/2006  T:  09/28/2006  Job:  [id number]   cc:   Ma Hillock

## 2010-10-11 NOTE — H&P (Signed)
NAMECHIEKO, Amber Shah             ACCOUNT NO.:  [id number]   MEDICAL RECORD NO.:  [id number]          PATIENT TYPE:  MAT   LOCATION:  [facility]                          FACILITY:  [facility]   PHYSICIAN:  Lenoard Aden, M.D.DATE OF BIRTH:  1970-09-10   DATE OF ADMISSION:  12/03/2004  DATE OF DISCHARGE:  12/03/2004                                HISTORY & PHYSICAL   CHIEF COMPLAINT:  Mild preeclampsia.   HISTORY OF PRESENT ILLNESS:  The patient is a 40 year old white female,  gravida 2, para 0, EDD February 06, 2005, at 30+ weeks gestation with mild  preeclampsia, elevated blood pressure, and proteinuria.   ALLERGIES:  No known drug allergies.   MEDICATIONS:  Prenatal vitamins and labetalol.   She has a pregnancy history contributory for one TAB in 1993.  She has a  remote history of Chlamydia.   FAMILY HISTORY:  Ovarian cancer, cerebrovascular accident, thyroid  dysfunction, hypertension, and cardiovascular disease.   PRENATAL LABORATORY DATA:  Blood type A positive, Rh antibody negative,  rubella immune, hepatitis and HIV negative, triple screen normal, 1-hour  glucose test normal.  Ultrasound performed most recently at 29 weeks on November 20, 2004, revealed an appropriately growing fetus with a normal AFI and an  anterior placenta.   PHYSICAL EXAMINATION:  VITAL SIGNS:  Blood pressure 150/100.  HEENT:  Normal.  LUNGS:  Clear.  HEART:  Regular rate and rhythm.  ABDOMEN:  Soft, gravid, and nontender.  PELVIC:  Cervix is closed, thick, and long.  EXTREMITIES:  No cords.  NEUROLOGY:  Nonfocal.  DTR's are 2+ with no evidence of clonus.  1+  pretibial edema noted.   IMPRESSION:  1.  30-week pregnancy.  2.  Mild preeclampsia with uric acid of 7.1 and 1.1 grams of proteinuria.   PLAN:  Admit.  Serial blood pressure monitoring.  Complete obstetric  ultrasound and NICU consult.  The patient has completed betamethasone.  Continuous monitoring with an NST daily, BPP once a week with  Dopplers and  recheck 24-hour urine and start 24-hour urine collection in 24 hours.  Serial PIH panels.  Will deliver for severe preeclampsia, otherwise will  proceed with expectant management, hospital bed rest, and strict I&O's.       RJT/MEDQ  D:  12/04/2004  T:  12/05/2004  Job:  [id number]

## 2010-10-11 NOTE — Discharge Summary (Signed)
NAMEMURIAL, BEAM             ACCOUNT NO.:  [id number]   MEDICAL RECORD NO.:  [id number]          PATIENT TYPE:  INP   LOCATION:  9138                          FACILITY:  [facility]   PHYSICIAN:  Lenoard Aden, M.D.DATE OF BIRTH:  [birth year]   DATE OF ADMISSION:  12/05/2004  DATE OF DISCHARGE:  01/06/2005                                 DISCHARGE SUMMARY   The patient was admitted at 51 1/2 weeks with preeclampsia.  She was given  steroids.  Blood pressures were monitored closely.  She was started on  labetalol.  She had twice daily fetal surveillance and aggressive intake and  outpatient observations, serial labs and serial biophysical profiles.  Over  the course of a 28 day hospitalization, she remained stable with volatile  blood pressures remaining in the criteria for mild preeclampsia given twice  weekly 24 hour urines.  On January 02, 2005, it was noted that despite  reassuring surveillance and stable blood pressure monitoring that her urine  protein was now greater than 5000 mg in a 24 hour specimen.  Therefore  decision was made at 35 1/2 weeks to proceed with delivery.  She had an  amniocentesis performed, which revealed a mature LSPG ratio 4:1 with  PG  positive and underwent an uncomplicated induction, which resulted in an  uncomplicated vaginal delivery of a full term living female.  Post partum, she  was placed on magnesium sulfate, which she tolerated well.  It was  discontinued after 24 hours.  Discharged to home on day #2.   DISCHARGE MEDICATIONS:  Prenatal vitamins  Labetalol.   She was given hypertensive precautions and told to follow up in the office  in one week.      Lenoard Aden, M.D.  Electronically Signed     RJT/MEDQ  D:  02/09/2005  T:  02/10/2005  Job:  [id number]

## 2015-11-19 ENCOUNTER — Other Ambulatory Visit: Payer: Self-pay | Admitting: Family Medicine

## 2015-11-19 DIAGNOSIS — M79606 Pain in leg, unspecified: Secondary | ICD-10-CM

## 2017-06-12 ENCOUNTER — Encounter: Payer: Self-pay | Admitting: Family Medicine

## 2017-06-12 ENCOUNTER — Ambulatory Visit: Payer: BC Managed Care – PPO | Admitting: Family Medicine

## 2017-06-12 VITALS — BP 138/87 | HR 110 | Ht 64.0 in | Wt 206.0 lb

## 2017-06-12 DIAGNOSIS — R200 Anesthesia of skin: Secondary | ICD-10-CM | POA: Diagnosis not present

## 2017-06-12 DIAGNOSIS — R202 Paresthesia of skin: Secondary | ICD-10-CM

## 2017-06-12 DIAGNOSIS — M79661 Pain in right lower leg: Secondary | ICD-10-CM | POA: Diagnosis not present

## 2017-06-12 DIAGNOSIS — G8929 Other chronic pain: Secondary | ICD-10-CM | POA: Diagnosis not present

## 2017-06-12 DIAGNOSIS — M545 Low back pain: Secondary | ICD-10-CM

## 2017-06-12 NOTE — Patient Instructions (Signed)
Please work on getting your low back to be left stiff.  If your numbness or tingling gets worse please return for further workup

## 2017-06-12 NOTE — Progress Notes (Signed)
   [facility] Noralee CharsAsiyah Ailene Royal, MD Phone: [phone]  Reason For Visit: New Patient, bilateral foot numbness   # Patient presenting with low back pain and bilateral foot numbness. States intermittent bilateral foot numbness - the whole planter surface, sometimes the toes as well-for about 6 months, usually at the end of the day. Patient has used compression socks which have helped. Has had TSH and A1C checked which was normal. Patient has had calf/peroneal pain, more in the right then the left. Has been worse with for the last couple of years. Has had ABIs,which were normal. Patient states that the pain is worse when she is very activity. Myofascial release has helped. Patient has had lower back pain for her entire adult life. Aggravated over the past 2 years. Patient states pain is worse with standing, relieved by flexion. No hx of trauma. No hx of sports injury.   History of cancer: none  Weak immune system:   History of IV drug use: none  History of steroid use: none   Symptoms Incontinence of bowel or bladder: None Fever: None Rest or Night pain:None  Weight Loss:  None   Past Medical History Reviewed problem list.  Medications- reviewed and updated No additions to family history Social history- patient is a non- smoker  Objective: BP 138/87   Pulse (!) 110   Ht 5\' 4"  (1.626 m)   Wt 206 lb (93.4 kg)   BMI 35.36 kg/m  Gen: NAD, alert, cooperative with exam Back: No abnormalities noted on inspection of lower back, paraspinal lumbar muscles strain, some tightness, normal range of motion, no pain with flexion or extension, negative straight leg test, neurovascularly intact Foot: No specific abnormalities noted on inspection, palpation of feet without any pain or numbness noted on soft touch,  Hip: Hip flexion, extension, internal and external rotation all within normal limits MSK: Right out toeing of right foot compared to left Skin: dry, intact, no rashes  or lesions  Assessment/Plan: See problem based a/p  1. Numbness and tingling of foot  Subjective bilateral numbness and tingling, none noted today on examination. Possibly due to bilateral foot swelling at the end of the day.  Unlikely caused by metabolic issue given patient that she has had a normal TSH and A1c. No radiation of numbness or tingling down leg unlikely from lower back -Follow-up as needed if paresthesias worsen -Could consider EMG/MCV in the future if no improvement  2. Chronic bilateral low back pain without sciatica Chronic low back pain with significant stiffness in lower back. Exercise to reduce low back stiffness    3. Right calf pain Normal ABIs per patient  Out-toeing noted on walking, provided insert

## 2017-06-14 NOTE — Progress Notes (Signed)
[facility] Attending Note: I have seen and examined this patient. I have discussed this patient with the resident and reviewed the assessment and plan as documented above. I agree with the resident's findings and plan..nsm

## 2018-07-03 ENCOUNTER — Other Ambulatory Visit: Payer: Self-pay | Admitting: Family Medicine

## 2018-07-03 DIAGNOSIS — G9519 Other vascular myelopathies: Secondary | ICD-10-CM

## 2018-07-03 DIAGNOSIS — M48062 Spinal stenosis, lumbar region with neurogenic claudication: Secondary | ICD-10-CM

## 2019-04-18 ENCOUNTER — Other Ambulatory Visit: Payer: Self-pay

## 2019-04-18 DIAGNOSIS — Z20822 Contact with and (suspected) exposure to covid-19: Secondary | ICD-10-CM

## 2019-04-19 LAB — NOVEL CORONAVIRUS, NAA: SARS-CoV-2, NAA: NOT DETECTED

## 2020-01-12 ENCOUNTER — Encounter: Payer: Self-pay | Admitting: Neurology

## 2020-01-12 ENCOUNTER — Telehealth: Payer: Self-pay | Admitting: Neurology

## 2020-01-12 ENCOUNTER — Ambulatory Visit: Payer: BC Managed Care – PPO | Admitting: Neurology

## 2020-01-12 VITALS — BP 138/94 | HR 80 | Ht 64.0 in | Wt 195.5 lb

## 2020-01-12 DIAGNOSIS — R202 Paresthesia of skin: Secondary | ICD-10-CM | POA: Diagnosis not present

## 2020-01-12 MED ORDER — GABAPENTIN 600 MG PO TABS: 600.0000 mg | ORAL_TABLET | Freq: Three times a day (TID) | ORAL | 1 refills | Status: DC

## 2020-01-12 NOTE — Telephone Encounter (Signed)
no to the covid questions MR Lumbar spine wo contrast Dr. Lockie Mola Auth: [id number] (exp. 01/12/20 to 07/09/20) . Patient is scheduled at [facility] for 01/17/20.

## 2020-01-12 NOTE — Progress Notes (Signed)
Reason for visit: Leg discomfort, paresthesias  Referring physician: Dr. Laneta Simmers is a 49 y.o. female  History of present illness:  Amber Shah is a 49 year old right-handed white female with a history of onset of sensory complaints involving the lower extremities that began in late 2019.  Initially, she had symptoms on the right leg primarily.  With walking, she would get some discomfort in the lateral aspect of the lower leg.  She may have tingling and burning sensations in the right foot and lower leg but more recently this has generalized to include both lower extremities and the symptoms are now relatively symmetric in nature.  The patient indicates that she feels better with resting, if she tries to walk, she begins to have increased symptoms within 5 to 10 minutes.  She will have burning paresthesias that go up to the hip level and all the way down to the foot with an achy pain in the lateral aspects of the lower leg bilaterally.  The patient does have some low back pain off and on, she takes Mobic for this with some benefit.  The patient denies any numbness on the body, she has no saddle distribution sensory changes, she has normal sensation of the bladder and bowels.  She denies any dysfunction of bowel or bladder control, but she does have a chronic issue with some stress incontinence of the bladder.  She has a sensation of fatigue of the legs but no true weakness.  If she walks a normal pace, she can only walk for about 1 mile before she has to stop and rest.  She has to rest for 5 to 10 minutes before the pain dissipates.  She denies any true muscle cramps.  She has no neck pain or discomfort in the arms and no numbness in the arms or hands or face.  She denies any systemic symptoms such as weight loss, night sweats, low-grade fevers, or generalized malaise.  She denies headaches or dizziness.  At times she feels that there is a stone underneath her feet, she has numbness in  the distal feet and in the ball of the feet that is more persistent.  She never has complete absence of sensory changes.  Due to the worsening bilateral lower extremity symptoms, the patient comes to this office for an evaluation.  She was seen by a sports medicine doctor a year ago, but no real therapy was offered.  The patient has been on gabapentin with some benefit, she has recently had to go up on the dose to 600 mg 3 times daily.  Past Medical History:  Diagnosis Date  . Depression   . HTN (hypertension)   . Low back pain   . Seasonal allergies   . Tingling     Past Surgical History:  Procedure Laterality Date  . WISDOM TOOTH EXTRACTION      Family History  Problem Relation Age of Onset  . Multiple myeloma Mother   . Heart failure Father     Social history:  reports that she has never smoked. She has never used smokeless tobacco. She reports current alcohol use. She reports that she does not use drugs.  Medications:  Prior to Admission medications   Medication Sig Start Date End Date Taking? Authorizing Provider  buPROPion (WELLBUTRIN SR) 150 MG 12 hr tablet Take 1 tablet by mouth in the morning and at bedtime. 10/25/19  Yes [provider]  fexofenadine (ALLEGRA) 180 MG tablet Take  180 mg by mouth daily.   Yes [provider]  fluticasone (FLONASE) 50 MCG/ACT nasal spray Place 1 spray into both nostrils daily.   Yes [provider]  gabapentin (NEURONTIN) 300 MG capsule Take 600 mg by mouth in the morning, at noon, and at bedtime. 11/19/19  Yes [provider]  ibuprofen (ADVIL) 200 MG tablet Take 200 mg by mouth as needed.   Yes [provider]  lisinopril-hydrochlorothiazide (ZESTORETIC) 10-12.5 MG tablet Take 1 tablet by mouth daily. 10/26/19  Yes [provider]  meloxicam (MOBIC) 15 MG tablet Take 15 mg by mouth daily. 11/25/19  Yes [provider]  triamcinolone cream (KENALOG) 0.1 % Apply 1 application topically  as needed.   Yes [provider]     No Known Allergies  ROS:  Out of a complete 14 system review of symptoms, the patient complains only of the following symptoms, and all other reviewed systems are negative.  Leg discomfort, paresthesias Low back pain  Blood pressure (!) 138/94, pulse 80, height $RemoveBe'5\' 4"'UFWgjeyTJ$  (1.626 m), weight 195 lb 8 oz (88.7 kg).  Physical Exam  General: The patient is alert and cooperative at the time of the examination.  The patient is moderately obese.  Eyes: Pupils are equal, round, and reactive to light. Discs are flat bilaterally.  There however appears to be some difference from 1 eye to the next, the disc margin on the right is less defined.  Neck: The neck is supple, no carotid bruits are noted.  Respiratory: The respiratory examination is clear.  Cardiovascular: The cardiovascular examination reveals a regular rate and rhythm, no obvious murmurs or rubs are noted.  Neuromuscular: Range move the low back is full.  Skin: Extremities are without significant edema.  Neurologic Exam  Mental status: The patient is alert and oriented x 3 at the time of the examination. The patient has apparent normal recent and remote memory, with an apparently normal attention span and concentration ability.  Cranial nerves: Facial symmetry is present. There is good sensation of the face to pinprick and soft touch bilaterally. The strength of the facial muscles and the muscles to head turning and shoulder shrug are normal bilaterally. Speech is well enunciated, no aphasia or dysarthria is noted. Extraocular movements are full. Visual fields are full. The tongue is midline, and the patient has symmetric elevation of the soft palate. No obvious hearing deficits are noted.  Motor: The motor testing reveals 5 over 5 strength of all 4 extremities. Good symmetric motor tone is noted throughout.  Sensory: Sensory testing is intact to pinprick, soft touch, vibration sensation,  and position sense on all 4 extremities.  The patient does feel that the sensation below the knees is altered, with pinprick evaluation there is a tingle sensation with stimulation bilaterally.  No evidence of extinction is noted.  Coordination: Cerebellar testing reveals good finger-nose-finger and heel-to-shin bilaterally.  Gait and station: Gait is normal. Tandem gait is normal. Romberg is negative. No drift is seen.  The patient is able to walk on heels and the toes bilaterally.  Reflexes: Deep tendon reflexes are symmetric, but is slightly depressed bilaterally.  Reflexes however are maintained and symmetric in the knees and ankles.  Toes are downgoing bilaterally.   Assessment/Plan:  1.  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  She has had vascular evaluations in the lower extremities that were  unremarkable previously.  The symptoms have been going on since late 2019, gradually worsening over time.  The patient be set up for MRI of the lumbar spine.  If the study is unremarkable, we will pursue EMG and nerve conduction study evaluation of the lower extremities.  The patient was given a prescription for the gabapentin.  She will follow-up here otherwise in 4 months.  Jill Alexanders MD 01/12/2020 8:27 AM  [facility] [address] [place], [place] [address]  Phone [phone] Fax [phone]

## 2020-01-17 ENCOUNTER — Ambulatory Visit: Payer: BC Managed Care – PPO

## 2020-01-17 ENCOUNTER — Other Ambulatory Visit: Payer: Self-pay

## 2020-01-17 DIAGNOSIS — R202 Paresthesia of skin: Secondary | ICD-10-CM | POA: Diagnosis not present

## 2020-01-19 ENCOUNTER — Telehealth: Payer: Self-pay | Admitting: Neurology

## 2020-01-19 DIAGNOSIS — R202 Paresthesia of skin: Secondary | ICD-10-CM

## 2020-01-19 NOTE — Telephone Encounter (Signed)
  I called the patient.  MRI of the lumbar spine does not show spinal stenosis that explain her symptoms, as previously discussed in the office I will set up nerve conductions on both legs and EMG on the right leg.   MRI lumbar 01/19/20:  IMPRESSION:   MRI lumbar spine (without) demonstrating: - At L4-5: disc bulging and facet hypertrophy with left lateral recess stenosis and moderate left foraminal stenosis. - At L5-S1: disc bulging and facet hypertrophy with mild biforaminal stenosis.

## 2020-02-20 ENCOUNTER — Ambulatory Visit (INDEPENDENT_AMBULATORY_CARE_PROVIDER_SITE_OTHER): Payer: BC Managed Care – PPO | Admitting: Neurology

## 2020-02-20 ENCOUNTER — Other Ambulatory Visit: Payer: Self-pay

## 2020-02-20 ENCOUNTER — Encounter: Payer: Self-pay | Admitting: Neurology

## 2020-02-20 DIAGNOSIS — R202 Paresthesia of skin: Secondary | ICD-10-CM

## 2020-02-20 DIAGNOSIS — R200 Anesthesia of skin: Secondary | ICD-10-CM

## 2020-02-20 NOTE — Progress Notes (Signed)
Please refer to EMG and nerve conduction procedure note.  

## 2020-02-20 NOTE — Procedures (Signed)
     HISTORY:  Amber Shah is a 49 year old patient with a history of pain in painful dysesthesias in the lower extremities, particularly in the feet.  MRI of the lumbar spine was unrevealing, she is being evaluated for the lower extremity sensory alterations.  NERVE CONDUCTION STUDIES:  Nerve conduction studies were performed on both lower extremities. The distal motor latencies and motor amplitudes for the peroneal and posterior tibial nerves were within normal limits. The nerve conduction velocities for these nerves were also normal. The sensory latencies for the peroneal and sural nerves were within normal limits. The F wave latencies for the posterior tibial nerves were within normal limits on the right, slightly prolonged on the left.   EMG STUDIES:  EMG study was performed on the right lower extremity:  The tibialis anterior muscle reveals 2 to 4K motor units with full recruitment. No fibrillations or positive waves were seen. The peroneus tertius muscle reveals 2 to 4K motor units with full recruitment. No fibrillations or positive waves were seen. The medial gastrocnemius muscle reveals 1 to 3K motor units with full recruitment. No fibrillations or positive waves were seen. The vastus lateralis muscle reveals 2 to 4K motor units with full recruitment. No fibrillations or positive waves were seen. The iliopsoas muscle reveals 2 to 4K motor units with full recruitment. No fibrillations or positive waves were seen. The biceps femoris muscle (long head) reveals 2 to 4K motor units with full recruitment. No fibrillations or positive waves were seen. The lumbosacral paraspinal muscles were tested at 3 levels, and revealed no abnormalities of insertional activity at all 3 levels tested. There was good relaxation.   IMPRESSION:  Nerve conduction studies done on both lower extremities were essentially unremarkable.  No evidence of a neuropathy was seen.  EMG evaluation of the right  lower extremity was unremarkable, no evidence of an overlying lumbosacral radiculopathy was seen.  Marlan Palau MD 02/20/2020 3:06 PM  [facility] [address] [place], [place] [address]  Phone [phone] Fax [phone]

## 2020-02-20 NOTE — Progress Notes (Addendum)
The patient comes in for EMG nerve conduction study evaluation today.  Nerve conductions of both legs were essentially normal, no evidence of any abnormalities were seen on the EMG of the right leg.  The patient will be sent for blood work, if this is unremarkable, we will consider MRI of the cervical and thoracic spine with and without contrast.    MNC    Nerve / Sites Muscle Latency Ref. Amplitude Ref. Rel Amp Segments Distance Velocity Ref. Area    ms ms mV mV %  cm m/s m/s mVms  R Peroneal - EDB     Ankle EDB 5.4 ?6.5 5.6 ?2.0 100 Ankle - EDB 9   18.6     Fib head EDB 10.1  5.8  104 Fib head - Ankle 26 54 ?44 19.8     Pop fossa EDB 12.0  5.7  99.3 Pop fossa - Fib head 10 54 ?44 21.5         Pop fossa - Ankle      L Peroneal - EDB     Ankle EDB 4.5 ?6.5 5.3 ?2.0 100 Ankle - EDB 9   14.4     Fib head EDB 9.5  4.7  88.6 Fib head - Ankle 25 50 ?44 14.1     Pop fossa EDB 11.6  4.6  97.9 Pop fossa - Fib head 10 48 ?44 13.7         Pop fossa - Ankle      R Tibial - AH     Ankle AH 4.1 ?5.8 8.0 ?4.0 100 Ankle - AH 9   22.6     Pop fossa AH 11.7  6.8  84.5 Pop fossa - Ankle 35 46 ?41 24.7  L Tibial - AH     Ankle AH 5.2 ?5.8 9.9 ?4.0 100 Ankle - AH 9   25.1     Pop fossa AH 12.8  7.9  79.7 Pop fossa - Ankle 34 45 ?41 20.5             SNC    Nerve / Sites Rec. Site Peak Lat Ref.  Amp Ref. Segments Distance    ms ms V V  cm  R Sural - Ankle (Calf)     Calf Ankle 3.6 ?4.4 9 ?6 Calf - Ankle 14  L Sural - Ankle (Calf)     Calf Ankle 3.7 ?4.4 7 ?6 Calf - Ankle 14  R Superficial peroneal - Ankle     Lat leg Ankle 3.8 ?4.4 3 ?6 Lat leg - Ankle 14  L Superficial peroneal - Ankle     Lat leg Ankle 3.8 ?4.4 2 ?6 Lat leg - Ankle 14             F  Wave    Nerve F Lat Ref.   ms ms  R Tibial - AH 55.4 ?56.0  L Tibial - AH 63.4 ?56.0

## 2020-02-24 ENCOUNTER — Telehealth: Payer: Self-pay | Admitting: Neurology

## 2020-02-24 DIAGNOSIS — R202 Paresthesia of skin: Secondary | ICD-10-CM

## 2020-02-24 LAB — ANGIOTENSIN CONVERTING ENZYME: Angio Convert Enzyme: 11 U/L — ABNORMAL LOW (ref 14–82)

## 2020-02-24 LAB — COMPREHENSIVE METABOLIC PANEL
ALT: 24 IU/L (ref 0–32)
AST: 14 IU/L (ref 0–40)
Albumin/Globulin Ratio: 2.2 (ref 1.2–2.2)
Albumin: 4.6 g/dL (ref 3.8–4.8)
Alkaline Phosphatase: 62 IU/L (ref 44–121)
BUN/Creatinine Ratio: 16 (ref 9–23)
BUN: 12 mg/dL (ref 6–24)
Bilirubin Total: 0.3 mg/dL (ref 0.0–1.2)
CO2: 24 mmol/L (ref 20–29)
Calcium: 9.6 mg/dL (ref 8.7–10.2)
Chloride: 101 mmol/L (ref 96–106)
Creatinine, Ser: 0.73 mg/dL (ref 0.57–1.00)
GFR calc Af Amer: 112 mL/min/{1.73_m2} (ref >59)
GFR calc non Af Amer: 97 mL/min/{1.73_m2} (ref >59)
Globulin, Total: 2.1 g/dL (ref 1.5–4.5)
Glucose: 125 mg/dL — ABNORMAL HIGH (ref 65–99)
Potassium: 3.8 mmol/L (ref 3.5–5.2)
Sodium: 138 mmol/L (ref 134–144)
Total Protein: 6.7 g/dL (ref 6.0–8.5)

## 2020-02-24 LAB — HIV ANTIBODY (ROUTINE TESTING W REFLEX): HIV Screen 4th Generation wRfx: NONREACTIVE

## 2020-02-24 LAB — RHEUMATOID FACTOR: Rheumatoid fact SerPl-aCnc: <10 IU/mL (ref 0.0–13.9)

## 2020-02-24 LAB — SEDIMENTATION RATE: Sed Rate: 16 mm/hr (ref 0–32)

## 2020-02-24 LAB — COPPER, SERUM: Copper: 98 ug/dL (ref 80–158)

## 2020-02-24 LAB — RPR: RPR Ser Ql: NONREACTIVE

## 2020-02-24 LAB — VITAMIN B12: Vitamin B-12: 413 pg/mL (ref 232–1245)

## 2020-02-24 LAB — B. BURGDORFI ANTIBODIES: Lyme IgG/IgM Ab: <0.91 {ISR} (ref 0.00–0.90)

## 2020-02-24 LAB — ANA W/REFLEX: Anti Nuclear Antibody (ANA): NEGATIVE

## 2020-02-24 NOTE — Telephone Encounter (Signed)
I called the patient, the blood work is unremarkable, could not leave a message, sent the results by MyChart.  I will have the nurse call her next week, if she is amenable to getting MRI of the cervical and thoracic spine we will get this done. The etiology of her lower extremity paresthesias has not been determined.

## 2020-02-27 NOTE — Telephone Encounter (Signed)
Spoke to pt, Results given, pt voiced understanding,  She states she is interested in the MRI.

## 2020-02-28 NOTE — Telephone Encounter (Signed)
I will go ahead and get the MRI studies set up.

## 2020-02-28 NOTE — Addendum Note (Signed)
Addended by: York Spaniel on: 02/28/2020 01:11 PM   Modules accepted: Orders

## 2020-02-29 NOTE — Telephone Encounter (Signed)
unable to leave vmail the mail boxi s full  [employer] Auth: [id number] (exp. 02/28/20 to 08/25/20)

## 2020-03-05 NOTE — Telephone Encounter (Signed)
x2 unable to reach pt the mail box is full

## 2020-05-14 ENCOUNTER — Ambulatory Visit: Payer: BC Managed Care – PPO | Admitting: Neurology

## 2020-05-14 ENCOUNTER — Encounter: Payer: Self-pay | Admitting: Neurology

## 2020-05-14 VITALS — BP 146/99 | HR 94 | Ht 65.0 in | Wt 200.5 lb

## 2020-05-14 DIAGNOSIS — R200 Anesthesia of skin: Secondary | ICD-10-CM

## 2020-05-14 DIAGNOSIS — R202 Paresthesia of skin: Secondary | ICD-10-CM

## 2020-05-14 MED ORDER — DULOXETINE HCL 30 MG PO CPEP: ORAL_CAPSULE | ORAL | 3 refills | Status: DC

## 2020-05-14 NOTE — Progress Notes (Signed)
Reason for visit: Bilateral foot discomfort, leg pain  Amber Shah is an 49 y.o. female  History of present illness:  Amber Shah is a 49 year old right-handed white female with a history of some discomfort and tingling in the feet, the patient also reports a buzzing sensation in the feet.  This appears to be present at all times, but she may have significant discomfort with weightbearing as well.  If she walks longer distances, she may have a hot burning discomfort in the calf area and up the leg that is worse on the right than the left.  She has a sensation of fatigue in the legs.  She claims that a couple years ago she did have vascular studies of the legs that were unremarkable.  She has had MRI of the low back that was somewhat unrevealing, did not show spinal stenosis or severe nerve root impingement.  Gabapentin has helped, she currently is on 600 mg 3 times daily.  She feels that the gabapentin is not adequate for controlling her symptoms.  She has discomfort when she is wearing a sock on her feet, she has to go barefooted, currently wearing flip-flops.  She reports some stress incontinence of the bladder.  MRI of the cervical and thoracic spine were ordered but never done.  The patient denies any hip discomfort, she reports that she has had some SI joint problems in the past.  Past Medical History:  Diagnosis Date  . Depression   . HTN (hypertension)   . Low back pain   . Seasonal allergies   . Tingling     Past Surgical History:  Procedure Laterality Date  . WISDOM TOOTH EXTRACTION      Family History  Problem Relation Age of Onset  . Multiple myeloma Mother   . Heart failure Father     Social history:  reports that she has never smoked. She has never used smokeless tobacco. She reports current alcohol use. She reports that she does not use drugs.   No Known Allergies  Medications:  Prior to Admission medications   Medication Sig Start Date End Date Taking?  Authorizing Provider  buPROPion (WELLBUTRIN SR) 150 MG 12 hr tablet Take 1 tablet by mouth in the morning and at bedtime. 10/25/19  Yes [provider]  fexofenadine (ALLEGRA) 180 MG tablet Take 180 mg by mouth daily.   Yes [provider]  fluticasone (FLONASE) 50 MCG/ACT nasal spray Place 1 spray into both nostrils daily.   Yes [provider]  gabapentin (NEURONTIN) 600 MG tablet Take 1 tablet (600 mg total) by mouth 3 (three) times daily. 01/12/20  Yes Kathrynn Ducking, MD  ibuprofen (ADVIL) 200 MG tablet Take 200 mg by mouth as needed.   Yes [provider]  lisinopril-hydrochlorothiazide (ZESTORETIC) 10-12.5 MG tablet Take 1 tablet by mouth daily. 10/26/19  Yes [provider]  meloxicam (MOBIC) 15 MG tablet Take 15 mg by mouth daily. 11/25/19  Yes [provider]  triamcinolone cream (KENALOG) 0.1 % Apply 1 application topically as needed.   Yes [provider]    ROS:  Out of a complete 14 system review of symptoms, the patient complains only of the following symptoms, and all other reviewed systems are negative.  Foot and leg discomfort Leg fatigue  Blood pressure (!) 146/99, pulse 94, height $RemoveBe'5\' 5"'ytgcNxMva$  (1.651 m), weight 200 lb 8 oz (90.9 kg).  Physical Exam  General: The patient is alert and cooperative at the  time of the examination.  The patient is moderately obese.  Neuromuscular: Range of movement of the lumbar spine is full.  Skin: No significant peripheral edema is noted.   Neurologic Exam  Mental status: The patient is alert and oriented x 3 at the time of the examination. The patient has apparent normal recent and remote memory, with an apparently normal attention span and concentration ability.   Cranial nerves: Facial symmetry is present. Speech is normal, no aphasia or dysarthria is noted. Extraocular movements are full. Visual fields are full.  Motor: The patient has good strength in all 4  extremities.  Sensory examination: Soft touch sensation is symmetric on the face, arms, and legs.  Coordination: The patient has good finger-nose-finger and heel-to-shin bilaterally.  Gait and station: The patient has a normal gait. Tandem gait is normal. Romberg is negative. No drift is seen.  Reflexes: Deep tendon reflexes are symmetric, reflexes are normal throughout but the ankle jerk reflexes are depressed bilaterally.   MRI lumbar 01/19/20:  IMPRESSION:   MRI lumbar spine (without) demonstrating: - At L4-5: disc bulging and facet hypertrophy with left lateral recess stenosis and moderate left foraminal stenosis. - At L5-S1: disc bulging and facet hypertrophy with mild biforaminal stenosis.   * MRI scan images were reviewed online. I agree with the written report.    Assessment/Plan:  1.  Bilateral leg and foot discomfort  The patient reports persistent sensory alteration in the feet, nerve conduction studies done previously were unremarkable but cannot rule out a small fiber neuropathy.  The patient be sent for skin biopsy looking for small fiber neuropathy.  If this is unremarkable, we will consider a referral to podiatry for evaluation of a possible plantar fasciitis.  The patient clearly has increased foot discomfort with weightbearing.  She will follow up here in 4 months.  Cymbalta will be added to the regimen taking 30 mg daily for 1 week and then go to 30 mg twice daily.  Jill Alexanders MD 05/14/2020 9:21 AM  [facility] [address] [place], [place] [address]  Phone [phone] Fax [phone]

## 2020-06-05 ENCOUNTER — Other Ambulatory Visit: Payer: Self-pay | Admitting: Neurology

## 2020-07-30 ENCOUNTER — Telehealth: Payer: Self-pay | Admitting: Neurology

## 2020-07-30 ENCOUNTER — Other Ambulatory Visit: Payer: Self-pay | Admitting: Emergency Medicine

## 2020-07-30 MED ORDER — GABAPENTIN 600 MG PO TABS
600.0000 mg | ORAL_TABLET | Freq: Three times a day (TID) | ORAL | 1 refills | Status: DC
Start: 2020-07-30 — End: 2021-02-04

## 2020-07-30 NOTE — Telephone Encounter (Signed)
The Cymbalta prescription was called in in January, should have enough to last 9 months on this prescription.  The gabapentin prescription was called in today.

## 2020-07-30 NOTE — Telephone Encounter (Signed)
Pt called stating that her pharmacy has sent several requests for her DULoxetine (CYMBALTA) 30 MG capsule and her gabapentin (NEURONTIN) 600 MG tablet to be called in for her and they are still waiting to hear from the office.  Pt is needing these medications called in to the [facility] on [address]

## 2020-09-17 ENCOUNTER — Ambulatory Visit: Payer: BC Managed Care – PPO | Admitting: Neurology

## 2020-09-17 ENCOUNTER — Encounter: Payer: Self-pay | Admitting: Neurology

## 2020-09-17 VITALS — BP 140/92 | HR 92 | Ht 65.0 in | Wt 209.8 lb

## 2020-09-17 DIAGNOSIS — R202 Paresthesia of skin: Secondary | ICD-10-CM

## 2020-09-17 DIAGNOSIS — G8929 Other chronic pain: Secondary | ICD-10-CM

## 2020-09-17 DIAGNOSIS — R200 Anesthesia of skin: Secondary | ICD-10-CM | POA: Diagnosis not present

## 2020-09-17 DIAGNOSIS — M545 Low back pain, unspecified: Secondary | ICD-10-CM

## 2020-09-17 NOTE — Progress Notes (Signed)
Reason for visit: Foot discomfort  Amber Shah is an 50 y.o. female  History of present illness:  Amber Shah is a 50 year old right-handed white female with a history of chronic low back pain and foot discomfort.  The patient is on gabapentin and Cymbalta, this combination seems to have been helpful to some degree with her pain.  The patient has increased low back pain with standing.  The patient is obese, she claims that she has gained about 25 pounds over the last year.  The patient does have some fatigue issues.  She was set up for a skin biopsy to exclude the possibility of a small fiber neuropathy but she never had this done.  In the past, MRI of the cervical spine and thoracic spine were ordered, they were never done.  The patient does have some increased pain with weightbearing, she cannot wear regular shoes, she has to use flip-flops.  MRI of the low back has been done, this was unrevealing as to the source of her discomfort.  Past Medical History:  Diagnosis Date  . Depression   . HTN (hypertension)   . Low back pain   . Seasonal allergies   . Tingling     Past Surgical History:  Procedure Laterality Date  . WISDOM TOOTH EXTRACTION      Family History  Problem Relation Age of Onset  . Multiple myeloma Mother   . Heart failure Father     Social history:  reports that she has never smoked. She has never used smokeless tobacco. She reports current alcohol use. She reports that she does not use drugs.   No Known Allergies  Medications:  Prior to Admission medications   Medication Sig Start Date End Date Taking? Authorizing Provider  buPROPion (WELLBUTRIN SR) 150 MG 12 hr tablet Take 1 tablet by mouth in the morning and at bedtime. 10/25/19  Yes [provider]  DULoxetine (CYMBALTA) 30 MG capsule Take one capsule two times daily 06/07/20  Yes Kathrynn Ducking, MD  fexofenadine (ALLEGRA) 180 MG tablet Take 180 mg by mouth daily.   Yes [provider]  fluticasone (FLONASE) 50 MCG/ACT nasal spray Place 1 spray into both nostrils daily.   Yes [provider]  gabapentin (NEURONTIN) 600 MG tablet Take 1 tablet (600 mg total) by mouth 3 (three) times daily. 07/30/20  Yes Kathrynn Ducking, MD  ibuprofen (ADVIL) 200 MG tablet Take 200 mg by mouth as needed.   Yes [provider]  lisinopril-hydrochlorothiazide (ZESTORETIC) 10-12.5 MG tablet Take 1 tablet by mouth daily. 10/26/19  Yes [provider]  meloxicam (MOBIC) 15 MG tablet Take 15 mg by mouth daily. 11/25/19  Yes [provider]  triamcinolone cream (KENALOG) 0.1 % Apply 1 application topically as needed.   Yes [provider]    ROS:  Out of a complete 14 system review of symptoms, the patient complains only of the following symptoms, and all other reviewed systems are negative.  Low back pain Foot discomfort Weight gain  Blood pressure (!) 140/92, pulse 92, height $RemoveBe'5\' 5"'wGNCRMnhu$  (1.651 m), weight 209 lb 12.8 oz (95.2 kg).  Physical Exam  General: The patient is alert and cooperative at the time of the examination.  The patient is obese.  Skin: No significant peripheral edema is noted.   Neurologic Exam  Mental status: The patient is alert and oriented x 3 at the time of the examination. The patient has apparent normal recent and  remote memory, with an apparently normal attention span and concentration ability.   Cranial nerves: Facial symmetry is present. Speech is normal, no aphasia or dysarthria is noted. Extraocular movements are full. Visual fields are full.  Motor: The patient has good strength in all 4 extremities.  Sensory examination: Soft touch sensation is symmetric on the face, arms, and legs.  Coordination: The patient has good finger-nose-finger and heel-to-shin bilaterally.  Gait and station: The patient has a normal gait. Tandem gait is normal. Romberg is negative. No drift is seen.  Reflexes: Deep tendon reflexes are  symmetric and normal with exception of some decreased ankle jerk reflexes seen.   Assessment/Plan:  1.  Foot pain, possible small fiber neuropathy  2.  Chronic low back pain  The patient will be sent for physical therapy for her low back.  We discussed that weight loss may help her back and her foot pain.  If she does decide she wants to have a skin biopsy, she will contact me.  We will continue the gabapentin and Cymbalta for now.  She will follow up here in 6 months.  Jill Alexanders MD 09/17/2020 9:56 AM  [facility] [address] [employer], [place] [address]  Phone [phone] Fax [phone]

## 2021-01-15 ENCOUNTER — Other Ambulatory Visit: Payer: Self-pay | Admitting: Neurology

## 2021-02-04 ENCOUNTER — Other Ambulatory Visit: Payer: Self-pay | Admitting: Neurology

## 2021-03-19 ENCOUNTER — Ambulatory Visit: Payer: BC Managed Care – PPO | Admitting: Adult Health

## 2021-08-22 ENCOUNTER — Other Ambulatory Visit: Payer: Self-pay | Admitting: Neurology

## 2021-09-26 ENCOUNTER — Other Ambulatory Visit: Payer: Self-pay | Admitting: Neurology

## 2021-12-19 ENCOUNTER — Ambulatory Visit (INDEPENDENT_AMBULATORY_CARE_PROVIDER_SITE_OTHER): Payer: BC Managed Care – PPO | Admitting: Podiatry

## 2021-12-19 ENCOUNTER — Ambulatory Visit (INDEPENDENT_AMBULATORY_CARE_PROVIDER_SITE_OTHER): Payer: BC Managed Care – PPO

## 2021-12-19 ENCOUNTER — Encounter: Payer: Self-pay | Admitting: Podiatry

## 2021-12-19 DIAGNOSIS — M7741 Metatarsalgia, right foot: Secondary | ICD-10-CM | POA: Diagnosis not present

## 2021-12-19 DIAGNOSIS — M21861 Other specified acquired deformities of right lower leg: Secondary | ICD-10-CM

## 2021-12-19 DIAGNOSIS — M7671 Peroneal tendinitis, right leg: Secondary | ICD-10-CM

## 2021-12-19 DIAGNOSIS — Q6672 Congenital pes cavus, left foot: Secondary | ICD-10-CM

## 2021-12-19 DIAGNOSIS — M21862 Other specified acquired deformities of left lower leg: Secondary | ICD-10-CM | POA: Diagnosis not present

## 2021-12-19 DIAGNOSIS — M62462 Contracture of muscle, left lower leg: Secondary | ICD-10-CM

## 2021-12-19 DIAGNOSIS — M7751 Other enthesopathy of right foot: Secondary | ICD-10-CM

## 2021-12-19 DIAGNOSIS — M7752 Other enthesopathy of left foot: Secondary | ICD-10-CM

## 2021-12-19 DIAGNOSIS — Q6671 Congenital pes cavus, right foot: Secondary | ICD-10-CM | POA: Diagnosis not present

## 2021-12-19 DIAGNOSIS — M7742 Metatarsalgia, left foot: Secondary | ICD-10-CM

## 2021-12-19 DIAGNOSIS — M7750 Other enthesopathy of unspecified foot: Secondary | ICD-10-CM

## 2021-12-19 DIAGNOSIS — M7672 Peroneal tendinitis, left leg: Secondary | ICD-10-CM | POA: Diagnosis not present

## 2021-12-19 DIAGNOSIS — M62461 Contracture of muscle, right lower leg: Secondary | ICD-10-CM

## 2021-12-19 NOTE — Progress Notes (Signed)
Patient presents today to be casted for custom molded orthotics. Dr. Lilian Kapur has been treating patient for plantar fasciitis.   Impression foam cast was taken. ABN signed.  Patient info-  Shoe size: 8.5 wide  Shoe style: athletic shoes   Height: 5'5"  Weight: 210lbs    Patient will be notified once orthotics arrive in office and reappoint for fitting at that time.

## 2021-12-22 NOTE — Progress Notes (Signed)
  Subjective:  Patient ID: Amber Shah, female    DOB: Feb 26, 1971,  MRN: [id number]  Chief Complaint  Patient presents with   Plantar Fasciitis    NP- Both feet pain with standing with pf    51 y.o. female presents with the above complaint. History confirmed with patient.  Most of the pain is more so on the ball of the foot is diffuse and nonspecific gets worse towards the end of the day its not always just when she gets up and starts moving.  Feels like the calf muscles really tight and she walks towards the outside of the foot and ankle  Objective:  Physical Exam: warm, good capillary refill, no trophic changes or ulcerative lesions, normal DP and PT pulses, normal sensory exam, and she has a pes cavus foot type, diffuse tenderness around the ball of the forefoot, mild tenderness on the peroneal tendons.  She has a given gastrocnemius equinus bilaterally  Radiographs: Multiple views x-ray of both feet: no fracture, dislocation, swelling or degenerative changes noted and pes cavus foot type Assessment:   1. Peroneal tendinitis of both lower legs   2. Pes cavus of both feet   3. Gastrocnemius equinus of left lower extremity   4. Gastrocnemius equinus of right lower extremity   5. Metatarsalgia of both feet      Plan:  Patient was evaluated and treated and all questions answered.  We reviewed her x-rays together and discussed that she has a fairly high arch and pes cavus foot type.  This is leading to overload of the lateral column and peroneal tendinitis as well as significant metatarsalgia.  This is compounded by her metatarsalgia from her gastrocnemius equinus.  We discussed surgical and nonsurgical treatment of all these conditions.  I do think she would benefit greatly from a custom molded foot orthosis to support the arch and prevent excessive inversion.  Additionally she should begin physical therapy and a referral was sent to [facility] and [place].  I will see  her back in 6 weeks for orthotics pickup and follow-up   No follow-ups on file.

## 2022-01-08 ENCOUNTER — Ambulatory Visit (INDEPENDENT_AMBULATORY_CARE_PROVIDER_SITE_OTHER): Payer: BC Managed Care – PPO

## 2022-01-08 DIAGNOSIS — Q6671 Congenital pes cavus, right foot: Secondary | ICD-10-CM

## 2022-01-08 DIAGNOSIS — Q6672 Congenital pes cavus, left foot: Secondary | ICD-10-CM

## 2022-01-08 NOTE — Progress Notes (Signed)
Patient presents today to pick up custom molded foot orthotics, diagnosed with pes cavus by Dr. Lilian Kapur.   Orthotics were dispensed and fit was satisfactory. Reviewed instructions for break-in and wear. Written instructions given to patient.  Patient will follow up as needed.   [facility] - order # [id number]

## 2022-02-04 ENCOUNTER — Ambulatory Visit: Payer: BC Managed Care – PPO | Admitting: Podiatry

## 2022-02-04 DIAGNOSIS — M7741 Metatarsalgia, right foot: Secondary | ICD-10-CM

## 2022-02-04 DIAGNOSIS — Q6672 Congenital pes cavus, left foot: Secondary | ICD-10-CM | POA: Diagnosis not present

## 2022-02-04 DIAGNOSIS — M7742 Metatarsalgia, left foot: Secondary | ICD-10-CM

## 2022-02-04 DIAGNOSIS — M792 Neuralgia and neuritis, unspecified: Secondary | ICD-10-CM

## 2022-02-04 DIAGNOSIS — Q6671 Congenital pes cavus, right foot: Secondary | ICD-10-CM

## 2022-02-04 MED ORDER — GABAPENTIN 100 MG PO CAPS: 100.0000 mg | ORAL_CAPSULE | Freq: Three times a day (TID) | ORAL | 3 refills | Status: AC

## 2022-02-06 ENCOUNTER — Encounter: Payer: Self-pay | Admitting: Podiatry

## 2022-02-06 NOTE — Progress Notes (Signed)
  Subjective:  Patient ID: Amber Shah, female    DOB: 05/16/1971,  MRN: [id number]  Chief Complaint  Patient presents with   tendinitis    Patient reports that arch support is much better since getting the custom orthotics. Her biggest problem now is the nerve pain. Burning, tingling and numbness feeling in toes and forefoot.     51 y.o. female presents with the above complaint. History confirmed with patient.  Orthotics have improved.  They are fairly comfortable.  She is still having quite a bit of the same type of pain and burning starting around the ankle.  Objective:  Physical Exam: warm, good capillary refill, no trophic changes or ulcerative lesions, normal DP and PT pulses, abnormal sensory exam, and she has a pes cavus foot type, diffuse tenderness around the ball of the forefoot, mild tenderness on the peroneal tendons.  She has a given gastrocnemius equinus bilaterally  Radiographs: Multiple views x-ray of both feet: no fracture, dislocation, swelling or degenerative changes noted and pes cavus foot type Assessment:   1. Neuropathic pain   2. Pes cavus of both feet   3. Metatarsalgia of both feet      Plan:  Patient was evaluated and treated and all questions answered.  Has improved with her orthotics, she still has significant neuropathic type pain.  She is not interested in further diagnostic testing for this because this was not helpful for her to have full treatment for it and was not satisfied with this with neurology previously.  She is interested in pain management for this.  I sent a referral for this.  Also discussed treatment of this with gabapentin which she has previously taken and was helpful but her neurologist would not fill it.  Rx for 100 mg 3 times daily sent to pharmacy.  She may uptitrate if necessary and will let me know if this is necessary.  She will return to see me as needed.  I do not see any indication that there is further pathology within the  foot or ankle that I can address directly myself to alleviate her pain and symptoms   Return if symptoms worsen or fail to improve.

## 2024-01-19 ENCOUNTER — Other Ambulatory Visit (HOSPITAL_BASED_OUTPATIENT_CLINIC_OR_DEPARTMENT_OTHER): Payer: Self-pay | Admitting: Family Medicine

## 2024-01-19 DIAGNOSIS — E782 Mixed hyperlipidemia: Secondary | ICD-10-CM

## 2024-02-09 ENCOUNTER — Ambulatory Visit (HOSPITAL_BASED_OUTPATIENT_CLINIC_OR_DEPARTMENT_OTHER)
Admission: RE | Admit: 2024-02-09 | Discharge: 2024-02-09 | Disposition: A | Discharge status: Home / Self Care | Payer: Self-pay | Source: Ambulatory Visit | Attending: Family Medicine | Admitting: Family Medicine

## 2024-02-09 DIAGNOSIS — E782 Mixed hyperlipidemia: Secondary | ICD-10-CM | POA: Insufficient documentation
